# Patient Record
Sex: Male | Born: 1935 | Race: White | Hispanic: No | Smoking: Former smoker
Health system: Southern US, Community
[De-identification: ages and names within clinical notes are randomized; demographics above are authoritative.]

## PROBLEM LIST (undated history)

## (undated) DIAGNOSIS — I10 Essential (primary) hypertension: Secondary | ICD-10-CM

## (undated) DIAGNOSIS — I2510 Atherosclerotic heart disease of native coronary artery without angina pectoris: Secondary | ICD-10-CM

## (undated) DIAGNOSIS — I714 Abdominal aortic aneurysm, without rupture: Secondary | ICD-10-CM

## (undated) DIAGNOSIS — I219 Acute myocardial infarction, unspecified: Secondary | ICD-10-CM

## (undated) DIAGNOSIS — F0390 Unspecified dementia without behavioral disturbance: Secondary | ICD-10-CM

## (undated) DIAGNOSIS — E119 Type 2 diabetes mellitus without complications: Secondary | ICD-10-CM

## (undated) DIAGNOSIS — E785 Hyperlipidemia, unspecified: Secondary | ICD-10-CM

## (undated) DIAGNOSIS — I739 Peripheral vascular disease, unspecified: Secondary | ICD-10-CM

## (undated) DIAGNOSIS — I6529 Occlusion and stenosis of unspecified carotid artery: Secondary | ICD-10-CM

## (undated) DIAGNOSIS — J60 Coalworker's pneumoconiosis: Secondary | ICD-10-CM

## (undated) HISTORY — DX: Occlusion and stenosis of unspecified carotid artery: I65.29

## (undated) HISTORY — DX: Coalworker's pneumoconiosis: J60

## (undated) HISTORY — DX: Acute myocardial infarction, unspecified: I21.9

## (undated) HISTORY — DX: Peripheral vascular disease, unspecified: I73.9

## (undated) HISTORY — PX: AORTO-FEMORAL BYPASS GRAFT: SHX885

## (undated) HISTORY — DX: Essential (primary) hypertension: I10

## (undated) HISTORY — DX: Hyperlipidemia, unspecified: E78.5

## (undated) HISTORY — PX: CAROTID ENDARTERECTOMY: SUR193

## (undated) HISTORY — DX: Type 2 diabetes mellitus without complications: E11.9

## (undated) HISTORY — DX: Atherosclerotic heart disease of native coronary artery without angina pectoris: I25.10

## (undated) HISTORY — PX: HERNIA REPAIR: SHX51

---

## 2008-06-06 DIAGNOSIS — I714 Abdominal aortic aneurysm, without rupture, unspecified: Secondary | ICD-10-CM

## 2008-06-06 HISTORY — DX: Abdominal aortic aneurysm, without rupture, unspecified: I71.40

## 2008-06-06 HISTORY — DX: Abdominal aortic aneurysm, without rupture: I71.4

## 2011-11-25 ENCOUNTER — Encounter: Payer: Self-pay | Admitting: Cardiovascular Disease

## 2012-08-08 ENCOUNTER — Other Ambulatory Visit: Payer: Self-pay | Admitting: *Deleted

## 2012-08-08 DIAGNOSIS — I70269 Atherosclerosis of native arteries of extremities with gangrene, unspecified extremity: Secondary | ICD-10-CM

## 2012-08-09 ENCOUNTER — Encounter (INDEPENDENT_AMBULATORY_CARE_PROVIDER_SITE_OTHER): Payer: MEDICARE | Admitting: *Deleted

## 2012-08-09 ENCOUNTER — Encounter: Payer: Self-pay | Admitting: Vascular Surgery

## 2012-08-09 ENCOUNTER — Ambulatory Visit (INDEPENDENT_AMBULATORY_CARE_PROVIDER_SITE_OTHER): Payer: MEDICARE | Admitting: Vascular Surgery

## 2012-08-09 VITALS — BP 142/63 | HR 77 | Temp 97.5°F | Ht 70.0 in | Wt 176.0 lb

## 2012-08-09 DIAGNOSIS — I70269 Atherosclerosis of native arteries of extremities with gangrene, unspecified extremity: Secondary | ICD-10-CM | POA: Insufficient documentation

## 2012-08-09 DIAGNOSIS — Z01818 Encounter for other preprocedural examination: Secondary | ICD-10-CM

## 2012-08-09 NOTE — Progress Notes (Signed)
[facility] [place] HISTORY AND PHYSICAL   History of Present Illness:  Patient is a 79 y.o. year old male who presents for evaluation of  Osteomyelitis of his left first toe.  The patient is referred by Dr. [name]. The patient has moderate dementia. Most of the history was provided by his niece. The patient apparently has had drainage from his left first toe for approximately 2 years. He denies any rest pain in the left foot. He states that the area of the toe is sore to palpation. He is a former smoker but quit in April of last year. Dr. [name] has recommended amputation of his left first toe for osteomyelitis. However the patient did not have a normal vascular exam and was referred to [place] for possible revascularization. He also has a history of coronary artery disease with a myocardial infarction in July 2013. This was in [facility]. She has also been depressed over the last year and has had 40 pound weight loss. Other medical problems include diabetes, hypertension, coronary disease, hyperlipidemia, "black lung disease". These are currently stable. The patient's niece says that his dementia has been stable over the past year. He is able to perform his daily activities. He is ambulatory. He is on doxycycline.  Past Medical History  Diagnosis Date  . Peripheral vascular disease   . Diabetes mellitus without complication   . Hypertension   . Myocardial infarction   . Hyperlipidemia   . Black lung disease   . Carotid artery occlusion     Past Surgical History  Procedure Laterality Date  . Carotid endarterectomy Left   . Hernia repair     abdominal operation unknown procedure   Social History History  Substance Use Topics  . Smoking status: Former Smoker    Types: Cigarettes    Quit date: 06/07/2011  . Smokeless tobacco: Never Used  . Alcohol Use: No    Family History History reviewed. No pertinent family history.  Allergies  No Known Allergies   Current  Outpatient Prescriptions  Medication Sig Dispense Refill  . Acetaminophen (Q-PAP PO) Take 500 mg by mouth 2 (two) times daily.      Marland Kitchen aspirin 325 MG tablet Take 325 mg by mouth daily.      Marland Kitchen atorvastatin (LIPITOR) 80 MG tablet Take 80 mg by mouth daily.      . carvedilol (COREG) 12.5 MG tablet Take 12.5 mg by mouth. Take 1/2 tablet every 12 hours      . clopidogrel (PLAVIX) 75 MG tablet Take 75 mg by mouth daily.      Marland Kitchen diltiazem (CARDIZEM) 120 MG tablet Take 120 mg by mouth daily.      Marland Kitchen doxycycline (VIBRAMYCIN) 100 MG capsule Take 100 mg by mouth 2 (two) times daily.      . Glucerna (GLUCERNA) LIQD Take 237 mLs by mouth. Drink 1 shake 1-3 times per day      . haloperidol (HALDOL) 0.5 MG tablet Take 0.5 mg by mouth 2 (two) times daily.      Marland Kitchen lisinopril (PRINIVIL,ZESTRIL) 20 MG tablet Take 20 mg by mouth daily. Take 1/2 tablet daily      . LORazepam (ATIVAN) 0.5 MG tablet Take 0.5 mg by mouth every 6 (six) hours as needed for anxiety.      . metFORMIN (GLUCOPHAGE) 1000 MG tablet Take 1,000 mg by mouth daily.      . methocarbamol (ROBAXIN) 500 MG tablet Take 500 mg by mouth 2 (two)  times daily as needed.      . mirtazapine (REMERON) 15 MG tablet Take 15 mg by mouth at bedtime.      . sertraline (ZOLOFT) 50 MG tablet Take 50 mg by mouth daily.       No current facility-administered medications for this visit.    ROS:   General:  + weight loss, no Fever, chills  HEENT: No recent headaches, no nasal bleeding, no visual changes, no sore throat  Neurologic: No dizziness, blackouts, seizures. No recent symptoms of stroke or mini- stroke. No recent episodes of slurred speech, or temporary blindness.  Cardiac: No recent episodes of chest pain/pressure, no shortness of breath at rest.  + shortness of breath with exertion.  Denies history of atrial fibrillation or irregular heartbeat  Vascular: No history of rest pain in feet.  No history of claudication.  + history of non-healing ulcer, No  history of DVT   Pulmonary: No home oxygen, no productive cough, no hemoptysis,  No asthma or wheezing  Musculoskeletal:  [ ]  Arthritis, [ ]  Low back pain,  [ ]  Joint pain  Hematologic:No history of hypercoagulable state.  No history of easy bleeding.  No history of anemia  Gastrointestinal: No hematochezia or melena,  No gastroesophageal reflux, no trouble swallowing  Urinary: [ ]  chronic Kidney disease, [ ]  on HD - [ ]  MWF or [ ]  TTHS, [ ]  Burning with urination, [ ]  Frequent urination, [ ]  Difficulty urinating;   Skin: No rashes  Psychological: No history of anxiety,  +history of depression   Physical Examination  Filed Vitals:   08/09/12 1429  BP: 142/63  Pulse: 77  Temp: 97.5 F (36.4 C)  TempSrc: Oral  Height: 5\' 10"  (1.778 m)  Weight: 176 lb (79.833 kg)  SpO2: 100%    Body mass index is 25.25 kg/(m^2).  General:  Alert and oriented, no acute distress HEENT: Normal Neck: No bruit or JVD, well-healed left neck scar Pulmonary: Clear to auscultation bilaterally Cardiac: Regular Rate and Rhythm without murmur Abdomen: Soft, non-tender, non-distended, no mass, well-healed midline laparotomy scar with well-healed vertical incisions each groin Skin: No rash, 1 cm ulceration tip of left first toe with clear drainage Extremity Pulses:  2+ radial, brachial, femoral, absent dorsalis pedis, posterior tibial pulses bilaterally Musculoskeletal: No deformity or edema  Neurologic: Upper and lower extremity motor 5/5 and symmetric  DATA: The patient had bilateral ABIs today which are reviewed and interpreted. ABI on the left was 0.52 right was 0.66   ASSESSMENT: Osteomyelitis left first toe with marginal perfusion of the left lower extremity.  Unknown history but patient has scars in exam that could be consistent with prior aortobifemoral bypass.   PLAN:  CT angiogram abdomen and pelvis with lower extremity runoff which should give [place] his abdominal anatomy as well as an  indication whether or not he might be a candidate for percutaneous angioplasty or stenting of his lower extremities. However, if it looks like the patient would not be a candidate for percutaneous revascularization I wanted to have further conversations with the patient's niece regarding whether or not he would be a candidate for an open operation. If he is not a candidate for an open operation, would most likely have Dr. [name] proceed with toe amputation and hope that this heals. We will have a better indication of this after he returns in 2 weeks after his CT scan.  Fabienne Bruns, MD [facility] [place] Office: [phone] Pager: [phone]

## 2012-08-14 ENCOUNTER — Other Ambulatory Visit: Payer: Self-pay | Admitting: *Deleted

## 2012-08-14 DIAGNOSIS — I70269 Atherosclerosis of native arteries of extremities with gangrene, unspecified extremity: Secondary | ICD-10-CM

## 2012-08-22 ENCOUNTER — Encounter: Payer: Self-pay | Admitting: Vascular Surgery

## 2012-08-23 ENCOUNTER — Other Ambulatory Visit: Payer: Self-pay

## 2012-08-23 ENCOUNTER — Other Ambulatory Visit: Payer: Self-pay | Admitting: *Deleted

## 2012-08-23 ENCOUNTER — Ambulatory Visit
Admission: RE | Admit: 2012-08-23 | Discharge: 2012-08-23 | Disposition: A | Discharge status: Home / Self Care | Payer: 59 | Source: Ambulatory Visit | Attending: Vascular Surgery | Admitting: Vascular Surgery

## 2012-08-23 ENCOUNTER — Encounter (HOSPITAL_COMMUNITY): Payer: Self-pay

## 2012-08-23 ENCOUNTER — Encounter: Payer: Self-pay | Admitting: Vascular Surgery

## 2012-08-23 ENCOUNTER — Telehealth: Payer: Self-pay | Admitting: Vascular Surgery

## 2012-08-23 ENCOUNTER — Ambulatory Visit (INDEPENDENT_AMBULATORY_CARE_PROVIDER_SITE_OTHER): Payer: 59 | Admitting: Vascular Surgery

## 2012-08-23 VITALS — BP 123/108 | HR 99 | Ht 70.0 in | Wt 178.3 lb

## 2012-08-23 DIAGNOSIS — Z01818 Encounter for other preprocedural examination: Secondary | ICD-10-CM

## 2012-08-23 DIAGNOSIS — I70269 Atherosclerosis of native arteries of extremities with gangrene, unspecified extremity: Secondary | ICD-10-CM

## 2012-08-23 DIAGNOSIS — Z01810 Encounter for preprocedural cardiovascular examination: Secondary | ICD-10-CM

## 2012-08-23 DIAGNOSIS — R911 Solitary pulmonary nodule: Secondary | ICD-10-CM | POA: Insufficient documentation

## 2012-08-23 MED ORDER — IOHEXOL 350 MG/ML SOLN
150.0000 mL | Freq: Once | INTRAVENOUS | Status: AC | PRN
  Administered 2012-08-23: 150 mL via INTRAVENOUS

## 2012-08-23 NOTE — Progress Notes (Signed)
[facility] [place] History and Physical    History of Present Illness:  Patient is a 79 y.o. year old male who presents for follow up evaluation of  Osteomyelitis of his left first toe.  The patient is referred by Dr. [name]. The patient has moderate dementia. Most of the history was provided by his niece. The patient apparently has had drainage from his left first toe for approximately 2 years. He denies any rest pain in the left foot. He states that the area of the toe is sore to palpation. He is a former smoker but quit in April of last year. Dr. [name] has recommended amputation of his left first toe for osteomyelitis. However the patient did not have a normal vascular exam and was referred to [place] for possible revascularization. He returns today for further evaluation after a CT angiogram with lower extremity runoff. He also has a history of coronary artery disease with a myocardial infarction in July 2013. This was in [facility]. He has also been depressed over the last year and has had 40 pound weight loss. Other medical problems include diabetes, hypertension, coronary disease, hyperlipidemia, "black lung disease". These are currently stable. The patient's niece says that his dementia has been stable over the past year. He is able to perform his daily activities. He is ambulatory. He is on doxycycline.    Past Medical History   Diagnosis  Date   .  Peripheral vascular disease     .  Diabetes mellitus without complication     .  Hypertension     .  Myocardial infarction     .  Hyperlipidemia     .  Black lung disease     .  Carotid artery occlusion         Past Surgical History   Procedure  Laterality  Date   .  Carotid endarterectomy  Left     .  Hernia repair        abdominal operation unknown procedure   Social History History   Substance Use Topics   .  Smoking status:  Former Smoker       Types:  Cigarettes       Quit date:  06/07/2011   .  Smokeless  tobacco:  Never Used   .  Alcohol Use:  No     Family History History reviewed. No pertinent family history.  Allergies  No Known Allergies     Current Outpatient Prescriptions   Medication  Sig  Dispense  Refill   .  Acetaminophen (Q-PAP PO)  Take 500 mg by mouth 2 (two) times daily.         Marland Kitchen  aspirin 325 MG tablet  Take 325 mg by mouth daily.         Marland Kitchen  atorvastatin (LIPITOR) 80 MG tablet  Take 80 mg by mouth daily.         .  carvedilol (COREG) 12.5 MG tablet  Take 12.5 mg by mouth. Take 1/2 tablet every 12 hours         .  clopidogrel (PLAVIX) 75 MG tablet  Take 75 mg by mouth daily.         Marland Kitchen  diltiazem (CARDIZEM) 120 MG tablet  Take 120 mg by mouth daily.         Marland Kitchen  doxycycline (VIBRAMYCIN) 100 MG capsule  Take 100 mg by mouth 2 (two) times daily.         Marland Kitchen  Glucerna (GLUCERNA) LIQD  Take 237 mLs by mouth. Drink 1 shake 1-3 times per day         .  haloperidol (HALDOL) 0.5 MG tablet  Take 0.5 mg by mouth 2 (two) times daily.         Marland Kitchen  lisinopril (PRINIVIL,ZESTRIL) 20 MG tablet  Take 20 mg by mouth daily. Take 1/2 tablet daily         .  LORazepam (ATIVAN) 0.5 MG tablet  Take 0.5 mg by mouth every 6 (six) hours as needed for anxiety.         .  metFORMIN (GLUCOPHAGE) 1000 MG tablet  Take 1,000 mg by mouth daily.         .  methocarbamol (ROBAXIN) 500 MG tablet  Take 500 mg by mouth 2 (two) times daily as needed.         .  mirtazapine (REMERON) 15 MG tablet  Take 15 mg by mouth at bedtime.         .  sertraline (ZOLOFT) 50 MG tablet  Take 50 mg by mouth daily.            No current facility-administered medications for this visit.     Physical Examination  Filed Vitals:   08/23/12 1119  BP: 123/108  Pulse: 99  Height: 5\' 10"  (1.778 m)  Weight: 178 lb 4.8 oz (80.876 kg)  SpO2: 93%   General:  Alert and oriented, no acute distress HEENT: Normal Neck: No bruit or JVD, well-healed left neck scar Pulmonary: Clear to auscultation bilaterally Cardiac: Regular Rate and  Rhythm without murmur Abdomen: Soft, non-tender, non-distended, no mass, well-healed midline laparotomy scar with well-healed vertical incisions each groin Skin: No rash, 1 cm ulceration tip of left first toe with clear drainage no significant change from previous office visit Extremity Pulses:  2+ radial, brachial, femoral, absent dorsalis pedis, posterior tibial pulses bilaterally Musculoskeletal: No deformity or edema     Neurologic: Upper and lower extremity motor 5/5 and symmetric  DATA: Prior ABIs were reviewed ABI on the left was 0.52 right was 0.66. CT angiogram of the abdomen and pelvis with lower extremity runoff is reviewed. The patient has a patent aortobifemoral bypass graft. The right superficial femoral artery is occluded. There is reconstitution of the distal popliteal artery with three-vessel runoff. He has subtotal occlusion of the left superficial femoral artery with one-vessel runoff via the left peroneal. There is an old compression fracture of L1 vertebral body. He has a 50% right renal artery stenosis. He also was noted to have a 5 mm right middle lobe lung nodule which will need a followup CT scan in one year.   ASSESSMENT: Osteomyelitis left first toe with marginal perfusion of the left lower extremity.  patient with prior aortobifemoral bypass which would make angioplasty or stenting of his superficial femoral artery technically difficult. I believe the best option would be to perform lower extremity arteriogram for operative planning purposes and then consideration for a left lower extremity femoral to below-knee popliteal bypass. At the time of his bypass procedure we could arrange for Dr. [name] to do the left first toe amputation. Risks benefits possible complications and procedure details were trying to the patient and his family member today. The risks benefits and complications of angiogram as well as left femoropopliteal bypass were discussed. Since the patient had contrast  today we will schedule his arteriogram one week from today.  He will also need cardiac risk stratification.  Plan: See above  Fabienne Bruns, MD [facility] [place] Office: [phone] Pager: [phone]

## 2012-08-23 NOTE — Telephone Encounter (Signed)
Dr Darrick Penna has requested cardiac clearance prior to fem-pop. Scheduled at [facility] and notified Shadae at [facility], dpm

## 2012-08-27 ENCOUNTER — Ambulatory Visit (INDEPENDENT_AMBULATORY_CARE_PROVIDER_SITE_OTHER): Payer: MEDICARE | Admitting: Cardiovascular Disease

## 2012-08-27 ENCOUNTER — Encounter: Payer: Self-pay | Admitting: Cardiovascular Disease

## 2012-08-27 VITALS — BP 114/60 | HR 74 | Ht 70.0 in | Wt 177.1 lb

## 2012-08-27 DIAGNOSIS — E785 Hyperlipidemia, unspecified: Secondary | ICD-10-CM | POA: Insufficient documentation

## 2012-08-27 DIAGNOSIS — Z01810 Encounter for preprocedural cardiovascular examination: Secondary | ICD-10-CM

## 2012-08-27 DIAGNOSIS — I10 Essential (primary) hypertension: Secondary | ICD-10-CM | POA: Insufficient documentation

## 2012-08-27 DIAGNOSIS — I2510 Atherosclerotic heart disease of native coronary artery without angina pectoris: Secondary | ICD-10-CM | POA: Insufficient documentation

## 2012-08-27 NOTE — Patient Instructions (Signed)
Your physician has requested that you have a lexiscan myoview. For further information please visit [email or web address]. Please follow instruction sheet, as given.   

## 2012-08-27 NOTE — Progress Notes (Addendum)
History of Present Illness: 79 yo male with history of dementia, DM, HTN, HLD, CAD, PAD referred today by Dr. Darrick Penna with [facility] for cardiovascular examination prior to distal aortogram with possible fem-pop bypass. He has a history of PAD with prior aorto-bifemoral bypass. Recently with osteromyelitis of the left great toe. CTA LE per Dr. Darrick Penna with patent aorto-bifemoral bypasses with severe disease in the left SFA with one vessel runoff to the left foot. The patient apparently has had drainage from his left first toe for approximately 2 years. He is a former smoker but quit in April of last year. He also has a history of coronary artery disease with a myocardial infarction in July 2013 in [place] at [facility]. He is a poor historian. His niece is with him today. It is not clear what was done for his heart at that time in [place] but she is sure a cath was done but she is not sure if a stent was done. No records are available.   He tells me that he feels well. He denies any chest pain or SOB. No dizziness, near syncope or syncope.   Primary Care Physician: Herschel Senegal  Past Medical History  Diagnosis Date  . Peripheral vascular disease   . Diabetes mellitus without complication   . Hypertension   . Myocardial infarction   . Hyperlipidemia   . Black lung disease   . Carotid artery occlusion   . CAD (coronary artery disease)     MI 2013    Past Surgical History  Procedure Laterality Date  . Carotid endarterectomy Left   . Hernia repair    . Aorto-femoral bypass graft      Current Outpatient Prescriptions  Medication Sig Dispense Refill  . acetaminophen (TYLENOL) 500 MG tablet Take 1,000 mg by mouth 3 (three) times daily.      Marland Kitchen aspirin 325 MG EC tablet Take 325 mg by mouth daily.      Marland Kitchen atorvastatin (LIPITOR) 80 MG tablet Take 40 mg by mouth at bedtime.       . carvedilol (COREG) 12.5 MG tablet Take 6.25 mg by mouth 2 (two) times daily with a meal.       .  clopidogrel (PLAVIX) 75 MG tablet Take 75 mg by mouth daily.      Marland Kitchen diltiazem (CARDIZEM CD) 120 MG 24 hr capsule Take 120 mg by mouth daily. At 1300.      Marland Kitchen Glucerna (GLUCERNA) LIQD Take 237 mLs by mouth 3 (three) times daily.       . haloperidol (HALDOL) 0.5 MG tablet Take 0.5 mg by mouth every morning.       . haloperidol (HALDOL) 1 MG tablet Take 1 mg by mouth at bedtime.      Marland Kitchen lisinopril (PRINIVIL,ZESTRIL) 20 MG tablet Take 10 mg by mouth daily.       Marland Kitchen LORazepam (ATIVAN) 0.5 MG tablet Take 0.5 mg by mouth every 6 (six) hours as needed for anxiety (agitation or restlessness).       . metFORMIN (GLUCOPHAGE) 1000 MG tablet Take 1,000 mg by mouth 2 (two) times daily with a meal.       . methocarbamol (ROBAXIN) 500 MG tablet Take 500 mg by mouth 2 (two) times daily as needed (spasms).       . mirtazapine (REMERON) 15 MG tablet Take 15 mg by mouth at bedtime.      . sertraline (ZOLOFT) 50 MG tablet Take 50 mg by mouth  daily.       No current facility-administered medications for this visit.    No Known Allergies  History   Social History  . Marital Status: Divorced    Spouse Name: N/A    Number of Children: 1  . Years of Education: N/A   Occupational History  . Retired-Railroad worker    Social History Main Topics  . Smoking status: Former Smoker    Types: Cigarettes    Quit date: 12/05/2011  . Smokeless tobacco: Never Used  . Alcohol Use: No  . Drug Use: No  . Sexually Active: Not on file   Other Topics Concern  . Not on file   Social History Narrative  . No narrative on file    Family History  Problem Relation Age of Onset  . Stroke Brother     x2  . Stroke Mother     Review of Systems:  As stated in the HPI and otherwise negative.   BP 114/60  Pulse 74  Ht 5\' 10"  (1.778 m)  Wt 177 lb 1.9 oz (80.341 kg)  BMI 25.41 kg/m2  Physical Examination: General: Well developed, well nourished, NAD HEENT: OP clear, mucus membranes moist SKIN: warm, dry. No  rashes. Neuro: No focal deficits Musculoskeletal: Muscle strength 5/5 all ext Psychiatric: Mood and affect normal Neck: No JVD, no carotid bruits, no thyromegaly, no lymphadenopathy. Lungs:Clear bilaterally, no wheezes, rhonci, crackles Cardiovascular: Regular rate and rhythm. No murmurs, gallops or rubs. Abdomen:Soft. Bowel sounds present. Non-tender.  Extremities: No lower extremity edema. Pulses are non-palpable in the bilateral DP/PT.  EKG: NSR, rate 74 bpm. Normal EKG  Assessment and Plan:   1. CAD: :Per report of his niece, he had an MI in July 2013 in [place]. No records are available. His niece is not sure what was done. ? If a stent was placed. Will try to get records from [facility] in [place]. Regardless of what they did there, he will need a stress test prior to his high risk, major vascular surgery. Will arrange Lexiscan stress myoview since he cannot walk on a treadmill. He has at least moderate dementia and will be high risk for any surgery. He also has a lung nodule on recent CT scan which will need further evaluation. I will leave this workup to Dr. Darrick Penna. May need to get pulmonary involved. I will review the old records when available and his stress test results and will make further recs at that time in regards to his cardiac risk for vascular surgery. No changes in meds at this time. Addendum 08/31/12: Cath report from [facility] of the [place]. 11/25/11: LAD small to moderate caliber with moderate caliber diagonal, 40% mid LAD stenosis. 50-70% proximal Circumflex stenosis. Large dominant RCA with patent proximal stent, distal 40% stenosis, total occlusion PLA, 90% distal PDA stenosis.

## 2012-08-29 ENCOUNTER — Ambulatory Visit (HOSPITAL_BASED_OUTPATIENT_CLINIC_OR_DEPARTMENT_OTHER): Payer: MEDICARE | Admitting: Radiology

## 2012-08-29 DIAGNOSIS — I2510 Atherosclerotic heart disease of native coronary artery without angina pectoris: Secondary | ICD-10-CM

## 2012-08-29 DIAGNOSIS — Z01810 Encounter for preprocedural cardiovascular examination: Secondary | ICD-10-CM

## 2012-08-29 DIAGNOSIS — R0989 Other specified symptoms and signs involving the circulatory and respiratory systems: Secondary | ICD-10-CM

## 2012-08-29 MED ORDER — TECHNETIUM TC 99M SESTAMIBI GENERIC - CARDIOLITE
11.0000 | Freq: Once | INTRAVENOUS | Status: AC | PRN
  Administered 2012-08-29: 11 via INTRAVENOUS

## 2012-08-30 ENCOUNTER — Ambulatory Visit (HOSPITAL_BASED_OUTPATIENT_CLINIC_OR_DEPARTMENT_OTHER): Payer: MEDICARE | Admitting: Radiology

## 2012-08-30 ENCOUNTER — Telehealth: Payer: Self-pay | Admitting: Cardiovascular Disease

## 2012-08-30 VITALS — Ht 70.0 in | Wt 180.0 lb

## 2012-08-30 DIAGNOSIS — Z01810 Encounter for preprocedural cardiovascular examination: Secondary | ICD-10-CM

## 2012-08-30 DIAGNOSIS — R0602 Shortness of breath: Secondary | ICD-10-CM

## 2012-08-30 DIAGNOSIS — I2510 Atherosclerotic heart disease of native coronary artery without angina pectoris: Secondary | ICD-10-CM

## 2012-08-30 MED ORDER — SODIUM CHLORIDE 0.9 % IV SOLN
INTRAVENOUS | Status: DC
  Administered 2012-08-31: 10:00:00 via INTRAVENOUS

## 2012-08-30 MED ORDER — REGADENOSON 0.4 MG/5ML IV SOLN
0.4000 mg | Freq: Once | INTRAVENOUS | Status: AC
  Administered 2012-08-30: 0.4 mg via INTRAVENOUS

## 2012-08-30 MED ORDER — TECHNETIUM TC 99M SESTAMIBI GENERIC - CARDIOLITE
33.0000 | Freq: Once | INTRAVENOUS | Status: AC | PRN
  Administered 2012-08-30: 33 via INTRAVENOUS

## 2012-08-30 NOTE — Telephone Encounter (Signed)
ROI faxed to [facility] [phone] 08/30/12/KM

## 2012-08-30 NOTE — Progress Notes (Signed)
[facility] SITE 3 NUCLEAR MED [address] [place], [place] [address] [phone]    Cardiology Nuclear Med Study  Henry Mcdaniel. is a 79 y.o. male     MRN : [id number]     DOB: 05/14/1934  Procedure Date: 08/30/2012  Nuclear Med Background Indication for Stress Test:  Evaluation for Ischemia and Surgical Clearance Pre-op Fem-Pop. Bypass (Dr. Fabienne Bruns) History:  12-2011 MI> Cath @ [place]( no records available), history of Dementia Cardiac Risk Factors: Carotid Disease, History of Smoking, Hypertension, Lipids, NIDDM and PVD  Symptoms:  DOE and Fatigue   Nuclear Pre-Procedure Caffeine/Decaff Intake:  None NPO After: 7:00am   Lungs:  Clear, diminished breath sounds, no wheezing O2 Sat: 97-99% on room air. IV 0.9% NS with Angio Cath:  22g  IV Site: R Hand  IV Started by:  Bonnita Levan, RN  Chest Size (in):  46 Cup Size: n/a  Height: 5\' 10"  (1.778 m)  Weight:  180 lb (81.647 kg)  BMI:  Body mass index is 25.83 kg/(m^2). Tech Comments:  Patient took meds this AM. Patient changed to a 2 Day protocol due to having coffee.    Nuclear Med Study 1 or 2 day study: 2 day  Stress Test Type:  Eugenie Birks  Reading MD: Cassell Clement, MD  Order Authorizing Provider:  Verne Carrow, MD  Resting Radionuclide: Technetium 84m Sestamibi  Resting Radionuclide Dose: 11.0 mCi   Stress Radionuclide:  Technetium 69m Sestamibi  Stress Radionuclide Dose: 33.0 mCi           Stress Protocol Rest HR: 62 Stress HR: 101  Rest BP: 134/63 Stress BP: 116/56  Exercise Time (min): n/a METS: n/a   Predicted Max HR: 144 bpm % Max HR: 70.14 bpm Rate Pressure Product: 21308   Dose of Adenosine (mg):  n/a Dose of Lexiscan: 0.4 mg  Dose of Atropine (mg): n/a Dose of Dobutamine: n/a mcg/kg/min (at max HR)  Stress Test Technologist: Irean Hong, RN  Nuclear Technologist:  Domenic Polite, CNMT     Rest Procedure:  Myocardial perfusion imaging was performed at rest 45 minutes  following the intravenous administration of Technetium 71m Sestamibi. Rest ECG: NSR - Normal EKG  Stress Procedure:  The patient received IV Lexiscan 0.4 mg over 15-seconds.  Technetium 105m Sestamibi injected at 30-seconds. No symptoms per patient.  Quantitative spect images were obtained after a 45 minute delay. Stress ECG: No significant change from baseline ECG  QPS Raw Data Images:  Mild diaphragmatic attenuation.  Normal left ventricular size. Stress Images:  Normal homogeneous uptake in all areas of the myocardium. Rest Images:  Normal homogeneous uptake in all areas of the myocardium. Subtraction (SDS):  No evidence of ischemia. Transient Ischemic Dilatation (Normal <1.22):  1.22 Lung/Heart Ratio (Normal <0.45):  0.36  Quantitative Gated Spect Images QGS EDV:  72 ml QGS ESV:  29 ml  Impression Exercise Capacity:  Lexiscan with no exercise. BP Response:  Normal blood pressure response. Clinical Symptoms:  No significant symptoms noted. ECG Impression:  No significant ST segment change suggestive of ischemia. Comparison with Prior Nuclear Study: No previous nuclear study performed  Overall Impression:  Normal stress nuclear study.  LV Ejection Fraction: 60%.  LV Wall Motion:  NL LV Function; NL Wall Motion  [employer]

## 2012-08-31 ENCOUNTER — Ambulatory Visit (HOSPITAL_COMMUNITY)
Admission: RE | Admit: 2012-08-31 | Discharge: 2012-08-31 | Disposition: A | Discharge status: Home / Self Care | Payer: MEDICARE | Source: Ambulatory Visit | Attending: Vascular Surgery | Admitting: Vascular Surgery

## 2012-08-31 ENCOUNTER — Telehealth: Payer: Self-pay | Admitting: Vascular Surgery

## 2012-08-31 ENCOUNTER — Encounter (HOSPITAL_COMMUNITY)
Admission: RE | Disposition: A | Discharge status: Home / Self Care | Payer: Self-pay | Source: Ambulatory Visit | Attending: Vascular Surgery

## 2012-08-31 ENCOUNTER — Encounter: Payer: Self-pay | Admitting: Cardiovascular Disease

## 2012-08-31 ENCOUNTER — Encounter (HOSPITAL_COMMUNITY): Payer: Self-pay | Admitting: *Deleted

## 2012-08-31 ENCOUNTER — Telehealth: Payer: Self-pay | Admitting: Cardiovascular Disease

## 2012-08-31 DIAGNOSIS — I70219 Atherosclerosis of native arteries of extremities with intermittent claudication, unspecified extremity: Secondary | ICD-10-CM | POA: Insufficient documentation

## 2012-08-31 DIAGNOSIS — I70269 Atherosclerosis of native arteries of extremities with gangrene, unspecified extremity: Secondary | ICD-10-CM

## 2012-08-31 DIAGNOSIS — M869 Osteomyelitis, unspecified: Secondary | ICD-10-CM | POA: Insufficient documentation

## 2012-08-31 DIAGNOSIS — I10 Essential (primary) hypertension: Secondary | ICD-10-CM | POA: Insufficient documentation

## 2012-08-31 DIAGNOSIS — E1169 Type 2 diabetes mellitus with other specified complication: Secondary | ICD-10-CM | POA: Insufficient documentation

## 2012-08-31 DIAGNOSIS — M9080 Osteopathy in diseases classified elsewhere, unspecified site: Secondary | ICD-10-CM | POA: Insufficient documentation

## 2012-08-31 DIAGNOSIS — F0390 Unspecified dementia without behavioral disturbance: Secondary | ICD-10-CM | POA: Insufficient documentation

## 2012-08-31 HISTORY — DX: Abdominal aortic aneurysm, without rupture: I71.4

## 2012-08-31 HISTORY — PX: ABDOMINAL AORTAGRAM: SHX5454

## 2012-08-31 HISTORY — PX: LOWER EXTREMITY ANGIOGRAM: SHX5508

## 2012-08-31 LAB — POCT I-STAT, CHEM 8
BUN: 23 mg/dL (ref 6–23)
Calcium, Ion: 1.26 mmol/L (ref 1.13–1.30)
Chloride: 105 mEq/L (ref 96–112)
Glucose, Bld: 184 mg/dL — ABNORMAL HIGH (ref 70–99)
TCO2: 30 mmol/L (ref 0–100)

## 2012-08-31 LAB — GLUCOSE, CAPILLARY: Glucose-Capillary: 166 mg/dL — ABNORMAL HIGH (ref 70–99)

## 2012-08-31 SURGERY — ABDOMINAL AORTAGRAM
Anesthesia: LOCAL

## 2012-08-31 MED ORDER — METOPROLOL TARTRATE 1 MG/ML IV SOLN: 2.0000 mg | INTRAVENOUS | Status: DC | PRN

## 2012-08-31 MED ORDER — LABETALOL HCL 5 MG/ML IV SOLN: 10.0000 mg | INTRAVENOUS | Status: DC | PRN

## 2012-08-31 MED ORDER — HYDRALAZINE HCL 20 MG/ML IJ SOLN: 10.0000 mg | INTRAMUSCULAR | Status: DC | PRN

## 2012-08-31 MED ORDER — ACETAMINOPHEN 325 MG RE SUPP: 325.0000 mg | RECTAL | Status: DC | PRN

## 2012-08-31 MED ORDER — MORPHINE SULFATE 10 MG/ML IJ SOLN: 2.0000 mg | INTRAMUSCULAR | Status: DC | PRN

## 2012-08-31 MED ORDER — LIDOCAINE HCL (PF) 1 % IJ SOLN
INTRAMUSCULAR | Status: AC
  Filled 2012-08-31: qty 30

## 2012-08-31 MED ORDER — HEPARIN (PORCINE) IN NACL 2-0.9 UNIT/ML-% IJ SOLN
INTRAMUSCULAR | Status: AC
  Filled 2012-08-31: qty 500

## 2012-08-31 MED ORDER — PHENOL 1.4 % MT LIQD: 1.0000 | OROMUCOSAL | Status: DC | PRN

## 2012-08-31 MED ORDER — ONDANSETRON HCL 4 MG/2ML IJ SOLN: 4.0000 mg | Freq: Four times a day (QID) | INTRAMUSCULAR | Status: DC | PRN

## 2012-08-31 MED ORDER — GUAIFENESIN-DM 100-10 MG/5ML PO SYRP: 15.0000 mL | ORAL_SOLUTION | ORAL | Status: DC | PRN

## 2012-08-31 MED ORDER — ACETAMINOPHEN 325 MG PO TABS: 325.0000 mg | ORAL_TABLET | ORAL | Status: DC | PRN

## 2012-08-31 NOTE — H&P (View-Only) (Signed)
[facility] [place] History and Physical    History of Present Illness:  Patient is a 79 y.o. year old male who presents for follow up evaluation of  Osteomyelitis of his left first toe.  The patient is referred by Dr. [name]. The patient has moderate dementia. Most of the history was provided by his niece. The patient apparently has had drainage from his left first toe for approximately 2 years. He denies any rest pain in the left foot. He states that the area of the toe is sore to palpation. He is a former smoker but quit in April of last year. Dr. [name] has recommended amputation of his left first toe for osteomyelitis. However the patient did not have a normal vascular exam and was referred to us for possible revascularization. He returns today for further evaluation after a CT angiogram with lower extremity runoff. He also has a history of coronary artery disease with a myocardial infarction in July 2013. This was in [place]. He has also been depressed over the last year and has had 40 pound weight loss. Other medical problems include diabetes, hypertension, coronary disease, hyperlipidemia, "black lung disease". These are currently stable. The patient's niece says that his dementia has been stable over the past year. He is able to perform his daily activities. He is ambulatory. He is on doxycycline.    Past Medical History   Diagnosis  Date   .  Peripheral vascular disease     .  Diabetes mellitus without complication     .  Hypertension     .  Myocardial infarction     .  Hyperlipidemia     .  Black lung disease     .  Carotid artery occlusion         Past Surgical History   Procedure  Laterality  Date   .  Carotid endarterectomy  Left     .  Hernia repair        abdominal operation unknown procedure   Social History History   Substance Use Topics   .  Smoking status:  Former Smoker       Types:  Cigarettes       Quit date:  06/07/2011   .  Smokeless  tobacco:  Never Used   .  Alcohol Use:  No     Family History History reviewed. No pertinent family history.  Allergies  No Known Allergies     Current Outpatient Prescriptions   Medication  Sig  Dispense  Refill   .  Acetaminophen (Q-PAP PO)  Take 500 mg by mouth 2 (two) times daily.         .  aspirin 325 MG tablet  Take 325 mg by mouth daily.         .  atorvastatin (LIPITOR) 80 MG tablet  Take 80 mg by mouth daily.         .  carvedilol (COREG) 12.5 MG tablet  Take 12.5 mg by mouth. Take 1/2 tablet every 12 hours         .  clopidogrel (PLAVIX) 75 MG tablet  Take 75 mg by mouth daily.         .  diltiazem (CARDIZEM) 120 MG tablet  Take 120 mg by mouth daily.         .  doxycycline (VIBRAMYCIN) 100 MG capsule  Take 100 mg by mouth 2 (two) times daily.         .    Glucerna (GLUCERNA) LIQD  Take 237 mLs by mouth. Drink 1 shake 1-3 times per day         .  haloperidol (HALDOL) 0.5 MG tablet  Take 0.5 mg by mouth 2 (two) times daily.         .  lisinopril (PRINIVIL,ZESTRIL) 20 MG tablet  Take 20 mg by mouth daily. Take 1/2 tablet daily         .  LORazepam (ATIVAN) 0.5 MG tablet  Take 0.5 mg by mouth every 6 (six) hours as needed for anxiety.         .  metFORMIN (GLUCOPHAGE) 1000 MG tablet  Take 1,000 mg by mouth daily.         .  methocarbamol (ROBAXIN) 500 MG tablet  Take 500 mg by mouth 2 (two) times daily as needed.         .  mirtazapine (REMERON) 15 MG tablet  Take 15 mg by mouth at bedtime.         .  sertraline (ZOLOFT) 50 MG tablet  Take 50 mg by mouth daily.            No current facility-administered medications for this visit.     Physical Examination  Filed Vitals:   08/23/12 1119  BP: 123/108  Pulse: 99  Height: 5' 10" (1.778 m)  Weight: 178 lb 4.8 oz (80.876 kg)  SpO2: 93%   General:  Alert and oriented, no acute distress HEENT: Normal Neck: No bruit or JVD, well-healed left neck scar Pulmonary: Clear to auscultation bilaterally Cardiac: Regular Rate and  Rhythm without murmur Abdomen: Soft, non-tender, non-distended, no mass, well-healed midline laparotomy scar with well-healed vertical incisions each groin Skin: No rash, 1 cm ulceration tip of left first toe with clear drainage no significant change from previous office visit Extremity Pulses:  2+ radial, brachial, femoral, absent dorsalis pedis, posterior tibial pulses bilaterally Musculoskeletal: No deformity or edema     Neurologic: Upper and lower extremity motor 5/5 and symmetric  DATA: Prior ABIs were reviewed ABI on the left was 0.52 right was 0.66. CT angiogram of the abdomen and pelvis with lower extremity runoff is reviewed. The patient has a patent aortobifemoral bypass graft. The right superficial femoral artery is occluded. There is reconstitution of the distal popliteal artery with three-vessel runoff. He has subtotal occlusion of the left superficial femoral artery with one-vessel runoff via the left peroneal. There is an old compression fracture of L1 vertebral body. He has a 50% right renal artery stenosis. He also was noted to have a 5 mm right middle lobe lung nodule which will need a followup CT scan in one year.   ASSESSMENT: Osteomyelitis left first toe with marginal perfusion of the left lower extremity.  patient with prior aortobifemoral bypass which would make angioplasty or stenting of his superficial femoral artery technically difficult. I believe the best option would be to perform lower extremity arteriogram for operative planning purposes and then consideration for a left lower extremity femoral to below-knee popliteal bypass. At the time of his bypass procedure we could arrange for Dr. [name] to do the left first toe amputation. Risks benefits possible complications and procedure details were trying to the patient and his family member today. The risks benefits and complications of angiogram as well as left femoropopliteal bypass were discussed. Since the patient had contrast  today we will schedule his arteriogram one week from today.  He will also need cardiac risk stratification.    Plan: See above  Cecilia Vancleve, MD [facility] [place] Office: [phone] Pager: [phone]  

## 2012-08-31 NOTE — Op Note (Signed)
      [facility]           OF [place]   Procedure: Aortogram with bilateral lower extremity runoff  Preoperative diagnosis: Claudication  Postoperative diagnosis: Same  Anesthesia Local  Operative details: After obtaining informed consent, the patient was taken to the PV LAB. The patient was placed in supine position on the Angio table. Both groins were prepped and draped in usual sterile fashion. Local anesthesia was infiltrated over the right common femoral artery. Initially, ultrasound was used to identify the common femoral artery and an attempt was made to use a micropuncture to establish access.    An introducer needle was used to cannulate the right common femoral artery and 035 versacore wire threaded into the abdominal aorta under fluoroscopic guidance. Next a 5 French sheath is placed over the guidewire in the right common femoral artery. A 5 French pigtail catheter was placed over the guidewire into the abdominal aorta and abdominal aortogram was obtained.  The patient has a pre-existing and 2 side aortobifemoral bypass. This is patent. The left and right renal arteries are patent. There is a 40% stenosis of the right renal artery. The iliac limbs of the aortobifemoral bypass are patent. There is no filling of the distal native iliac arteries. Next the pigtail catheter was pulled down into the aortic graft and lower extremity runoff views obtained. The left and right common and profunda femoris arteries are patent.  In the right lower extremity, the right superficial femoral artery is occluded. The popliteal artery reconstitutes via profunda collaterals. There is two-vessel runoff via the peroneal and posterior tibial artery to the right foot.  In the left lower extremity, the left superficial femoral artery is diffusely diseased throughout its entire course with multiple segments of 70-90% stenosis. The popliteal artery is patent. There is one-vessel runoff to the  left foot. This is via the peroneal artery.       Next the pigtail catheter was removed a right guidewire.The 5 Fr sheath was pulled and hemostasis obtained with direct pressure. The patient tolerated the procedure well and there were no complications. Patient was taken to the holding area in stable condition.  Operative findings: Patent aortobifemoral bypass with him to side anastomosis.  Right superficial femoral artery occlusion with two-vessel runoff via the peroneal and posterior tibial artery.  Diffusely diseased but patent left superficial femoral artery. One-vessel runoff to the left foot via the peroneal.   Operative management: The patient will be scheduled in the near future for a left femoral to above-knee popliteal bypass. After that the patient should have reasonable perfusion to heal a left first toe amputation by Dr. [name].  Fabienne Bruns, MD [facility] [place] Office: [phone] Pager: [phone]

## 2012-08-31 NOTE — Telephone Encounter (Addendum)
Message copied by Rosalyn Charters on Fri Aug 31, 2012  3:34 PM ------      Message from: [name]      Created: Fri Aug 31, 2012 11:43 AM       He needs a referral to pulmonary for "black lung" to see if ok for surgery per Dr Thomasene Lot and his POA      ----- Message -----         From: Sherren Kerns, MD         Sent: 08/31/2012  11:03 AM           To: [name], [name], RN            Aortogram with bilat runoff            He needs to be scheduled for left fem pop bypass and [name] to amputatate first toe same day.  This can be done whenever you can coordinate my schedule with [name].  His power of atty is Henry Mcdaniel [phone].  His niece is usually with him her name is Henry Mcdaniel.            Henry Mcdaniel       ------  notified patient's nephew of appt. with dr. Sherene Sires [phone] 09-10-12 at 9:30

## 2012-08-31 NOTE — Interval H&P Note (Signed)
History and Physical Interval Note:  08/31/2012 10:02 AM  Henry Mcdaniel.  has presented today for surgery, with the diagnosis of pvd  The various methods of treatment have been discussed with the patient and family. After consideration of risks, benefits and other options for treatment, the patient has consented to  Procedure(s): ABDOMINAL AORTAGRAM (N/A) as a surgical intervention .  The patient's history has been reviewed, patient examined, no change in status, stable for surgery.  I have reviewed the patient's chart and labs.  Questions were answered to the patient's satisfaction.     FIELDS,CHARLES E

## 2012-09-04 ENCOUNTER — Ambulatory Visit (INDEPENDENT_AMBULATORY_CARE_PROVIDER_SITE_OTHER): Payer: MEDICARE | Admitting: Internal Medicine

## 2012-09-04 ENCOUNTER — Encounter: Payer: Self-pay | Admitting: Internal Medicine

## 2012-09-04 VITALS — BP 110/76 | HR 72 | Temp 97.5°F | Ht 69.0 in | Wt 181.0 lb

## 2012-09-04 DIAGNOSIS — R911 Solitary pulmonary nodule: Secondary | ICD-10-CM

## 2012-09-04 NOTE — Patient Instructions (Addendum)
You are cleared for surgery but I would recommend a follow up pulmonary visit in 6 months with a cxr

## 2012-09-04 NOTE — Progress Notes (Signed)
  Subjective:    Patient ID: Henry Mcdaniel., male    DOB: 1933-07-02  MRN: [id number]  HPI  4 yowm quit smoking 12/2011 in assisted living due to memory issues followed by [facility] referred by Dr Darrick Penna for preop eval of abn cxr    09/04/2012 1st pulmonary eval cc doe x years, no worse since quit smoking, only occurs with exertion and not at rest or noct or need for saba  No obvious daytime variabilty or assoc chronic cough or cp or chest tightness, subjective wheeze overt sinus or hb symptoms. No unusual exp hx or h/o childhood pna/ asthma or premature birth to his knowledge.   Sleeping ok without nocturnal  or early am exacerbation  of respiratory  c/o's or need for noct saba. Also denies any obvious fluctuation of symptoms with weather or environmental changes or other aggravating or alleviating factors except as outlined above   Review of Systems  Constitutional: Positive for appetite change and unexpected weight change. Negative for fever.  HENT: Negative for ear pain, nosebleeds, congestion, sore throat, rhinorrhea, sneezing, trouble swallowing, dental problem, postnasal drip and sinus pressure.   Eyes: Negative for redness and itching.  Respiratory: Positive for shortness of breath. Negative for cough, chest tightness and wheezing.   Cardiovascular: Negative for palpitations and leg swelling.  Gastrointestinal: Negative for nausea and vomiting.  Genitourinary: Negative for dysuria.  Musculoskeletal: Negative for joint swelling.  Skin: Negative for rash.  Neurological: Negative for headaches.  Hematological: Does not bruise/bleed easily.  Psychiatric/Behavioral: Negative for dysphoric mood. The patient is not nervous/anxious.        Objective:   Physical Exam  amb wm nad Wt Readings from Last 3 Encounters:  09/04/12 181 lb (82.101 kg)  08/31/12 180 lb (81.647 kg)  08/31/12 180 lb (81.647 kg)    HEENT: nl dentition, turbinates, and orophanx. Nl external ear canals without  cough reflex   NECK :  without JVD/Nodes/TM/ nl carotid upstrokes bilaterally   LUNGS: no acc muscle use, clear to A and P bilaterally without cough on insp or exp maneuvers   CV:  RRR  no s3 or murmur or increase in P2, no edema   ABD:  soft and nontender with nl excursion in the supine position. No bruits or organomegaly, bowel sounds nl  MS:  warm without deformities, calf tenderness, cyanosis or clubbing  SKIN: warm and dry without lesions    NEURO:  alert, approp, no deficits   Ct 08/23/12 5 mm right middle lobe pulmonary nodule. If the patient  is at high risk for bronchogenic carcinoma, follow-up chest CT at 6-  12 months is recommended.         Assessment & Plan:

## 2012-09-06 NOTE — Assessment & Plan Note (Signed)
Although there are clearly abnormalities on CT scan, they should probably be considered "microscopic" since not obvious on plain cxr .     In the setting of obvious "macroscopic" health issues,  There is no need to approach this patient differently in the short run but do rec f/u in 6 months as per guidelines for limited ct of the spn which is most likely benign  Discussed in detail all the  indications, usual  risks and alternatives  relative to the benefits with patient who agrees to proceed with conservative f/u as outlined above, placed in tickle file for recall 02/23/13

## 2012-09-07 ENCOUNTER — Other Ambulatory Visit: Payer: Self-pay | Admitting: *Deleted

## 2012-09-10 ENCOUNTER — Institutional Professional Consult (permissible substitution): Payer: MEDICARE | Admitting: Internal Medicine

## 2012-09-12 ENCOUNTER — Encounter (HOSPITAL_COMMUNITY): Payer: Self-pay | Admitting: Pharmacy Technician

## 2012-09-17 ENCOUNTER — Encounter (HOSPITAL_COMMUNITY): Payer: Self-pay

## 2012-09-17 ENCOUNTER — Ambulatory Visit (HOSPITAL_COMMUNITY)
Admission: RE | Admit: 2012-09-17 | Discharge: 2012-09-17 | Disposition: A | Discharge status: Home / Self Care | Payer: MEDICARE | Source: Ambulatory Visit | Attending: Anesthesiology | Admitting: Anesthesiology

## 2012-09-17 ENCOUNTER — Encounter (HOSPITAL_COMMUNITY)
Admission: RE | Admit: 2012-09-17 | Discharge: 2012-09-17 | Disposition: A | Discharge status: Home / Self Care | Payer: MEDICARE | Source: Ambulatory Visit | Attending: Vascular Surgery | Admitting: Vascular Surgery

## 2012-09-17 DIAGNOSIS — I739 Peripheral vascular disease, unspecified: Secondary | ICD-10-CM | POA: Insufficient documentation

## 2012-09-17 DIAGNOSIS — I252 Old myocardial infarction: Secondary | ICD-10-CM | POA: Insufficient documentation

## 2012-09-17 DIAGNOSIS — I2510 Atherosclerotic heart disease of native coronary artery without angina pectoris: Secondary | ICD-10-CM | POA: Insufficient documentation

## 2012-09-17 DIAGNOSIS — F0390 Unspecified dementia without behavioral disturbance: Secondary | ICD-10-CM | POA: Insufficient documentation

## 2012-09-17 DIAGNOSIS — I6529 Occlusion and stenosis of unspecified carotid artery: Secondary | ICD-10-CM | POA: Insufficient documentation

## 2012-09-17 DIAGNOSIS — J60 Coalworker's pneumoconiosis: Secondary | ICD-10-CM | POA: Insufficient documentation

## 2012-09-17 DIAGNOSIS — I10 Essential (primary) hypertension: Secondary | ICD-10-CM | POA: Insufficient documentation

## 2012-09-17 DIAGNOSIS — E785 Hyperlipidemia, unspecified: Secondary | ICD-10-CM | POA: Insufficient documentation

## 2012-09-17 DIAGNOSIS — E119 Type 2 diabetes mellitus without complications: Secondary | ICD-10-CM | POA: Insufficient documentation

## 2012-09-17 DIAGNOSIS — Z01812 Encounter for preprocedural laboratory examination: Secondary | ICD-10-CM | POA: Insufficient documentation

## 2012-09-17 DIAGNOSIS — Z01818 Encounter for other preprocedural examination: Secondary | ICD-10-CM | POA: Insufficient documentation

## 2012-09-17 DIAGNOSIS — Z87891 Personal history of nicotine dependence: Secondary | ICD-10-CM | POA: Insufficient documentation

## 2012-09-17 HISTORY — DX: Unspecified dementia, unspecified severity, without behavioral disturbance, psychotic disturbance, mood disturbance, and anxiety: F03.90

## 2012-09-17 LAB — COMPREHENSIVE METABOLIC PANEL
Albumin: 3.6 g/dL (ref 3.5–5.2)
BUN: 26 mg/dL — ABNORMAL HIGH (ref 6–23)
Chloride: 104 mEq/L (ref 96–112)
Creatinine, Ser: 1.22 mg/dL (ref 0.50–1.35)
GFR calc Af Amer: 65 mL/min — ABNORMAL LOW (ref >90)
Total Bilirubin: 0.3 mg/dL (ref 0.3–1.2)
Total Protein: 7.1 g/dL (ref 6.0–8.3)

## 2012-09-17 LAB — PROTIME-INR
INR: 1 (ref 0.00–1.49)
Prothrombin Time: 13.1 seconds (ref 11.6–15.2)

## 2012-09-17 LAB — SURGICAL PCR SCREEN: MRSA, PCR: NEGATIVE

## 2012-09-17 LAB — TYPE AND SCREEN

## 2012-09-17 LAB — CBC
HCT: 37.6 % — ABNORMAL LOW (ref 39.0–52.0)
MCHC: 34.3 g/dL (ref 30.0–36.0)
MCV: 90.8 fL (ref 78.0–100.0)
RDW: 13.8 % (ref 11.5–15.5)

## 2012-09-17 NOTE — Pre-Procedure Instructions (Addendum)
Henry Mcdaniel.  09/17/2012   Your procedure is scheduled on:  September 24, 2012  Report to [facility] Short Stay Center at 5:30 AM.  Call this number if you have problems the morning of surgery: [phone]   Remember:   Do not eat food or drink liquids after midnight.   Take these medicines the morning of surgery with A SIP OF WATER: tylenol, carvedilol(coreg), diltiazem(cardizem), sertraline(zoloft)    Do not wear jewelry, make-up or nail polish.  Do not wear lotions, powders, or perfumes. You may wear deodorant.  Do not shave 48 hours prior to surgery. Men may shave face and neck.  Do not bring valuables to the hospital.  Contacts, dentures or bridgework may not be worn into surgery.  Leave suitcase in the car. After surgery it may be brought to your room.  For patients admitted to the hospital, checkout time is 11:00 AM the day of  discharge.   Patients discharged the day of surgery will not be allowed to drive  home.  Name and phone number of your driver: family/friend  Special Instructions: Shower using CHG 2 nights before surgery and the night before surgery.  If you shower the day of surgery use CHG.  Use special wash - you have one bottle of CHG for all showers.  You should use approximately 1/3 of the bottle for each shower.   Please read over the following fact sheets that you were given: Pain Booklet, Coughing and Deep Breathing, Blood Transfusion Information and Surgical Site Infection Prevention

## 2012-09-20 NOTE — Progress Notes (Signed)
Anesthesia chart review: Patient is a 79 year old male scheduled for left femoral-popliteal bypass graft by Dr. Darrick Penna on 09/24/2012. History includes CAD/MI 12/2011 [facility] in [place]), former smoker since 12/2011, "black lung disease", 5 mm RML lung node by 08/23/12 CT with 6-12 months follow-up recommended, AAA/PAD s/p AFBG, HLD, DM2, carotid occlusive diease s/p left CEA, moderate dementia.  He was seen by cardiologist Dr. Clifton Irbin and pulmonologist Dr. Sherene Sires for preoperative clearance (see Notes in Epic).    Nuclear stress test on 08/30/12 showed: Normal stress nuclear study. LV Ejection Fraction: 60%. LV Wall Motion: NL LV Function; NL Wall Motion.  Cardiac cath from [facility] on 11/25/11 (scanned under Media tab, 11/25/11): LAD small to moderate caliber with moderate caliber diagonal, 40% mid LAD stenosis. 50-70% proximal Circumflex stenosis. Large dominant RCA with patent proximal stent, distal 40% stenosis, total occlusion PLA, 90% distal PDA stenosis.   EKG on 08/27/12 showed NSR, probable insignificant q waves in inferior leads.  CXR on 09/17/12 showed no acute abnormalities.  Preoperative labs noted.  He is scheduled for a UA on the day of surgery. (I think that he was unable to provide a specimen at his PAT appointment.)  He has been cleared by pulmonology and cardiology.  If no significant change in his status then would anticipate he could proceed as planned.  Velna Ochs [facility] Short Stay Center/Anesthesiology Phone [phone] 09/20/2012 9:39 AM

## 2012-09-23 MED ORDER — DEXTROSE 5 % IV SOLN
1.5000 g | INTRAVENOUS | Status: AC
  Administered 2012-09-24: 1.5 g via INTRAVENOUS
  Filled 2012-09-23: qty 1.5

## 2012-09-24 ENCOUNTER — Encounter (HOSPITAL_COMMUNITY): Payer: Self-pay | Admitting: Vascular Surgery

## 2012-09-24 ENCOUNTER — Encounter (HOSPITAL_COMMUNITY)
Admission: RE | Disposition: A | Discharge status: Skilled Nursing Facility | Payer: Self-pay | Source: Ambulatory Visit | Attending: Vascular Surgery

## 2012-09-24 ENCOUNTER — Inpatient Hospital Stay (HOSPITAL_COMMUNITY): Payer: MEDICARE

## 2012-09-24 ENCOUNTER — Encounter: Payer: Self-pay | Admitting: Physician Assistant

## 2012-09-24 ENCOUNTER — Telehealth: Payer: Self-pay | Admitting: Vascular Surgery

## 2012-09-24 ENCOUNTER — Inpatient Hospital Stay (HOSPITAL_COMMUNITY): Payer: MEDICARE | Admitting: Certified Registered"

## 2012-09-24 ENCOUNTER — Encounter (HOSPITAL_COMMUNITY): Payer: Self-pay | Admitting: *Deleted

## 2012-09-24 ENCOUNTER — Inpatient Hospital Stay (HOSPITAL_COMMUNITY)
Admission: RE | Admit: 2012-09-24 | Discharge: 2012-09-27 | DRG: 253 | Disposition: A | Discharge status: Skilled Nursing Facility | Payer: MEDICARE | Source: Ambulatory Visit | Attending: Vascular Surgery | Admitting: Vascular Surgery

## 2012-09-24 DIAGNOSIS — Z87891 Personal history of nicotine dependence: Secondary | ICD-10-CM

## 2012-09-24 DIAGNOSIS — I2510 Atherosclerotic heart disease of native coronary artery without angina pectoris: Secondary | ICD-10-CM | POA: Diagnosis present

## 2012-09-24 DIAGNOSIS — M9080 Osteopathy in diseases classified elsewhere, unspecified site: Secondary | ICD-10-CM | POA: Diagnosis present

## 2012-09-24 DIAGNOSIS — R911 Solitary pulmonary nodule: Secondary | ICD-10-CM | POA: Diagnosis present

## 2012-09-24 DIAGNOSIS — E1169 Type 2 diabetes mellitus with other specified complication: Secondary | ICD-10-CM | POA: Diagnosis present

## 2012-09-24 DIAGNOSIS — I70269 Atherosclerosis of native arteries of extremities with gangrene, unspecified extremity: Principal | ICD-10-CM | POA: Diagnosis present

## 2012-09-24 DIAGNOSIS — F329 Major depressive disorder, single episode, unspecified: Secondary | ICD-10-CM | POA: Diagnosis present

## 2012-09-24 DIAGNOSIS — I252 Old myocardial infarction: Secondary | ICD-10-CM

## 2012-09-24 DIAGNOSIS — Z7902 Long term (current) use of antithrombotics/antiplatelets: Secondary | ICD-10-CM

## 2012-09-24 DIAGNOSIS — M86679 Other chronic osteomyelitis, unspecified ankle and foot: Secondary | ICD-10-CM | POA: Diagnosis present

## 2012-09-24 DIAGNOSIS — F0390 Unspecified dementia without behavioral disturbance: Secondary | ICD-10-CM | POA: Diagnosis present

## 2012-09-24 DIAGNOSIS — R634 Abnormal weight loss: Secondary | ICD-10-CM | POA: Diagnosis present

## 2012-09-24 DIAGNOSIS — D62 Acute posthemorrhagic anemia: Secondary | ICD-10-CM | POA: Diagnosis not present

## 2012-09-24 DIAGNOSIS — J60 Coalworker's pneumoconiosis: Secondary | ICD-10-CM | POA: Diagnosis present

## 2012-09-24 DIAGNOSIS — E785 Hyperlipidemia, unspecified: Secondary | ICD-10-CM | POA: Diagnosis present

## 2012-09-24 DIAGNOSIS — I10 Essential (primary) hypertension: Secondary | ICD-10-CM | POA: Diagnosis present

## 2012-09-24 DIAGNOSIS — I70219 Atherosclerosis of native arteries of extremities with intermittent claudication, unspecified extremity: Secondary | ICD-10-CM

## 2012-09-24 DIAGNOSIS — Z79899 Other long term (current) drug therapy: Secondary | ICD-10-CM

## 2012-09-24 DIAGNOSIS — Z7982 Long term (current) use of aspirin: Secondary | ICD-10-CM

## 2012-09-24 DIAGNOSIS — F3289 Other specified depressive episodes: Secondary | ICD-10-CM | POA: Diagnosis present

## 2012-09-24 HISTORY — PX: AMPUTATION: SHX166

## 2012-09-24 HISTORY — PX: FEMORAL-POPLITEAL BYPASS GRAFT: SHX937

## 2012-09-24 LAB — CREATININE, SERUM
Creatinine, Ser: 0.96 mg/dL (ref 0.50–1.35)
GFR calc Af Amer: >90 mL/min (ref >90)
GFR calc non Af Amer: 79 mL/min — ABNORMAL LOW (ref >90)

## 2012-09-24 LAB — POCT I-STAT, CHEM 8
BUN: 34 mg/dL — ABNORMAL HIGH (ref 6–23)
Chloride: 107 mEq/L (ref 96–112)
Creatinine, Ser: 1.1 mg/dL (ref 0.50–1.35)
Glucose, Bld: 165 mg/dL — ABNORMAL HIGH (ref 70–99)
Hemoglobin: 13.6 g/dL (ref 13.0–17.0)
Potassium: 4.6 mEq/L (ref 3.5–5.1)

## 2012-09-24 LAB — CBC
HCT: 33.1 % — ABNORMAL LOW (ref 39.0–52.0)
Hemoglobin: 11.4 g/dL — ABNORMAL LOW (ref 13.0–17.0)
MCHC: 34.4 g/dL (ref 30.0–36.0)
RBC: 3.62 MIL/uL — ABNORMAL LOW (ref 4.22–5.81)
WBC: 6.6 10*3/uL (ref 4.0–10.5)

## 2012-09-24 LAB — URINALYSIS, ROUTINE W REFLEX MICROSCOPIC
Bilirubin Urine: NEGATIVE
Glucose, UA: NEGATIVE mg/dL
Hgb urine dipstick: NEGATIVE
Specific Gravity, Urine: 1.023 (ref 1.005–1.030)
Urobilinogen, UA: 0.2 mg/dL (ref 0.0–1.0)

## 2012-09-24 LAB — GLUCOSE, CAPILLARY
Glucose-Capillary: 166 mg/dL — ABNORMAL HIGH (ref 70–99)
Glucose-Capillary: 140 mg/dL — ABNORMAL HIGH (ref 70–99)

## 2012-09-24 SURGERY — BYPASS GRAFT FEMORAL-POPLITEAL ARTERY
Anesthesia: General | Site: Toe | Laterality: Left | Wound class: Clean

## 2012-09-24 MED ORDER — GLUCERNA SHAKE PO LIQD
237.0000 mL | Freq: Three times a day (TID) | ORAL | Status: DC
  Administered 2012-09-25 – 2012-09-27 (×5): 237 mL via ORAL

## 2012-09-24 MED ORDER — ACETAMINOPHEN 325 MG PO TABS: 325.0000 mg | ORAL_TABLET | ORAL | Status: DC | PRN

## 2012-09-24 MED ORDER — LABETALOL HCL 5 MG/ML IV SOLN
10.0000 mg | INTRAVENOUS | Status: DC | PRN
Start: 2012-09-24 — End: 2012-09-27
  Filled 2012-09-24: qty 4

## 2012-09-24 MED ORDER — IOHEXOL 300 MG/ML  SOLN
INTRAMUSCULAR | Status: DC | PRN
  Administered 2012-09-24: 50 mL via INTRA_ARTERIAL

## 2012-09-24 MED ORDER — ATORVASTATIN CALCIUM 40 MG PO TABS
40.0000 mg | ORAL_TABLET | Freq: Every day | ORAL | Status: DC
  Administered 2012-09-24 – 2012-09-26 (×3): 40 mg via ORAL
  Filled 2012-09-24 (×4): qty 1

## 2012-09-24 MED ORDER — NEOSTIGMINE METHYLSULFATE 1 MG/ML IJ SOLN
INTRAMUSCULAR | Status: DC | PRN
  Administered 2012-09-24: 2 mg via INTRAVENOUS

## 2012-09-24 MED ORDER — LACTATED RINGERS IV SOLN
INTRAVENOUS | Status: DC | PRN
  Administered 2012-09-24 (×3): via INTRAVENOUS

## 2012-09-24 MED ORDER — HALOPERIDOL 0.5 MG PO TABS
0.5000 mg | ORAL_TABLET | Freq: Every morning | ORAL | Status: DC
  Administered 2012-09-25 – 2012-09-26 (×2): 0.5 mg via ORAL
  Filled 2012-09-24 (×4): qty 1

## 2012-09-24 MED ORDER — SODIUM CHLORIDE 0.9 % IV SOLN
10.0000 mg | INTRAVENOUS | Status: DC | PRN
  Administered 2012-09-24: 40 ug/min via INTRAVENOUS

## 2012-09-24 MED ORDER — DILTIAZEM HCL ER COATED BEADS 120 MG PO CP24
120.0000 mg | ORAL_CAPSULE | Freq: Every day | ORAL | Status: DC
  Administered 2012-09-25 – 2012-09-27 (×3): 120 mg via ORAL
  Filled 2012-09-24 (×3): qty 1

## 2012-09-24 MED ORDER — MIRTAZAPINE 15 MG PO TABS
15.0000 mg | ORAL_TABLET | Freq: Every day | ORAL | Status: DC
  Administered 2012-09-24 – 2012-09-26 (×3): 15 mg via ORAL
  Filled 2012-09-24 (×4): qty 1

## 2012-09-24 MED ORDER — ACETAMINOPHEN 650 MG RE SUPP: 325.0000 mg | RECTAL | Status: DC | PRN

## 2012-09-24 MED ORDER — ARTIFICIAL TEARS OP OINT
TOPICAL_OINTMENT | OPHTHALMIC | Status: DC | PRN
  Administered 2012-09-24: 1 via OPHTHALMIC

## 2012-09-24 MED ORDER — DOCUSATE SODIUM 100 MG PO CAPS
100.0000 mg | ORAL_CAPSULE | Freq: Every day | ORAL | Status: DC
  Administered 2012-09-25 – 2012-09-27 (×3): 100 mg via ORAL
  Filled 2012-09-24 (×3): qty 1

## 2012-09-24 MED ORDER — MORPHINE SULFATE 2 MG/ML IJ SOLN: 2.0000 mg | INTRAMUSCULAR | Status: DC | PRN

## 2012-09-24 MED ORDER — ONDANSETRON HCL 4 MG/2ML IJ SOLN: 4.0000 mg | Freq: Four times a day (QID) | INTRAMUSCULAR | Status: DC | PRN

## 2012-09-24 MED ORDER — PHENOL 1.4 % MT LIQD: 1.0000 | OROMUCOSAL | Status: DC | PRN

## 2012-09-24 MED ORDER — ONDANSETRON HCL 4 MG/2ML IJ SOLN
INTRAMUSCULAR | Status: DC | PRN
  Administered 2012-09-24: 4 mg via INTRAVENOUS

## 2012-09-24 MED ORDER — DOPAMINE-DEXTROSE 3.2-5 MG/ML-% IV SOLN: 3.0000 ug/kg/min | INTRAVENOUS | Status: DC

## 2012-09-24 MED ORDER — PHENYLEPHRINE HCL 10 MG/ML IJ SOLN
INTRAMUSCULAR | Status: DC | PRN
  Administered 2012-09-24 (×2): 160 ug via INTRAVENOUS

## 2012-09-24 MED ORDER — PANTOPRAZOLE SODIUM 40 MG PO TBEC
40.0000 mg | DELAYED_RELEASE_TABLET | Freq: Every day | ORAL | Status: DC
  Administered 2012-09-24 – 2012-09-27 (×4): 40 mg via ORAL
  Filled 2012-09-24 (×4): qty 1

## 2012-09-24 MED ORDER — GLYCOPYRROLATE 0.2 MG/ML IJ SOLN
INTRAMUSCULAR | Status: DC | PRN
Start: 2012-09-24 — End: 2012-09-24
  Administered 2012-09-24: 0.2 mg via INTRAVENOUS
  Administered 2012-09-24: 0.4 mg via INTRAVENOUS

## 2012-09-24 MED ORDER — SODIUM CHLORIDE 0.9 % IV SOLN
500.0000 mL | Freq: Once | INTRAVENOUS | Status: AC | PRN
  Administered 2012-09-24: 500 mL via INTRAVENOUS

## 2012-09-24 MED ORDER — METFORMIN HCL 500 MG PO TABS
1000.0000 mg | ORAL_TABLET | Freq: Two times a day (BID) | ORAL | Status: DC
  Administered 2012-09-24 – 2012-09-27 (×6): 1000 mg via ORAL
  Filled 2012-09-24 (×8): qty 2

## 2012-09-24 MED ORDER — HALOPERIDOL 1 MG PO TABS
1.0000 mg | ORAL_TABLET | Freq: Every day | ORAL | Status: DC
  Administered 2012-09-24 – 2012-09-26 (×3): 1 mg via ORAL
  Filled 2012-09-24 (×4): qty 1

## 2012-09-24 MED ORDER — LIDOCAINE HCL (CARDIAC) 20 MG/ML IV SOLN
INTRAVENOUS | Status: DC | PRN
  Administered 2012-09-24: 60 mg via INTRAVENOUS

## 2012-09-24 MED ORDER — LISINOPRIL 10 MG PO TABS
10.0000 mg | ORAL_TABLET | Freq: Every day | ORAL | Status: DC
Start: 2012-09-24 — End: 2012-09-27
  Administered 2012-09-24 – 2012-09-27 (×4): 10 mg via ORAL
  Filled 2012-09-24 (×4): qty 1

## 2012-09-24 MED ORDER — GUAIFENESIN-DM 100-10 MG/5ML PO SYRP: 15.0000 mL | ORAL_SOLUTION | ORAL | Status: DC | PRN

## 2012-09-24 MED ORDER — DEXTROSE 5 % IV SOLN
1.5000 g | Freq: Two times a day (BID) | INTRAVENOUS | Status: AC
  Administered 2012-09-24 – 2012-09-25 (×2): 1.5 g via INTRAVENOUS
  Filled 2012-09-24 (×2): qty 1.5

## 2012-09-24 MED ORDER — PAPAVERINE HCL 30 MG/ML IJ SOLN
INTRAMUSCULAR | Status: AC
  Filled 2012-09-24: qty 2

## 2012-09-24 MED ORDER — PROTAMINE SULFATE 10 MG/ML IV SOLN
INTRAVENOUS | Status: DC | PRN
  Administered 2012-09-24 (×2): 20 mg via INTRAVENOUS
  Administered 2012-09-24 (×2): 10 mg via INTRAVENOUS
  Administered 2012-09-24 (×2): 20 mg via INTRAVENOUS

## 2012-09-24 MED ORDER — CLOPIDOGREL BISULFATE 75 MG PO TABS
75.0000 mg | ORAL_TABLET | Freq: Every day | ORAL | Status: DC
  Administered 2012-09-25 – 2012-09-27 (×3): 75 mg via ORAL
  Filled 2012-09-24 (×3): qty 1

## 2012-09-24 MED ORDER — POTASSIUM CHLORIDE CRYS ER 20 MEQ PO TBCR: 20.0000 meq | EXTENDED_RELEASE_TABLET | Freq: Once | ORAL | Status: AC | PRN

## 2012-09-24 MED ORDER — 0.9 % SODIUM CHLORIDE (POUR BTL) OPTIME
TOPICAL | Status: DC | PRN
  Administered 2012-09-24: 2000 mL

## 2012-09-24 MED ORDER — ENOXAPARIN SODIUM 30 MG/0.3ML ~~LOC~~ SOLN
30.0000 mg | SUBCUTANEOUS | Status: DC
  Administered 2012-09-25 – 2012-09-26 (×2): 30 mg via SUBCUTANEOUS
  Filled 2012-09-24 (×3): qty 0.3

## 2012-09-24 MED ORDER — METOPROLOL TARTRATE 1 MG/ML IV SOLN: 2.0000 mg | INTRAVENOUS | Status: DC | PRN

## 2012-09-24 MED ORDER — CARVEDILOL 6.25 MG PO TABS
6.2500 mg | ORAL_TABLET | Freq: Two times a day (BID) | ORAL | Status: DC
  Administered 2012-09-24 – 2012-09-27 (×6): 6.25 mg via ORAL
  Filled 2012-09-24 (×8): qty 1

## 2012-09-24 MED ORDER — SODIUM CHLORIDE 0.9 % IR SOLN
Status: DC | PRN
  Administered 2012-09-24: 08:00:00

## 2012-09-24 MED ORDER — OXYCODONE HCL 5 MG PO TABS
5.0000 mg | ORAL_TABLET | ORAL | Status: DC | PRN
  Administered 2012-09-24: 10 mg via ORAL
  Filled 2012-09-24: qty 2

## 2012-09-24 MED ORDER — SODIUM CHLORIDE 0.9 % IV SOLN: INTRAVENOUS | Status: DC

## 2012-09-24 MED ORDER — ALUM & MAG HYDROXIDE-SIMETH 200-200-20 MG/5ML PO SUSP: 15.0000 mL | ORAL | Status: DC | PRN

## 2012-09-24 MED ORDER — THROMBIN 20000 UNITS EX SOLR
CUTANEOUS | Status: AC
  Filled 2012-09-24: qty 20000

## 2012-09-24 MED ORDER — PROPOFOL 10 MG/ML IV BOLUS
INTRAVENOUS | Status: DC | PRN
  Administered 2012-09-24: 120 mg via INTRAVENOUS

## 2012-09-24 MED ORDER — ROCURONIUM BROMIDE 100 MG/10ML IV SOLN
INTRAVENOUS | Status: DC | PRN
  Administered 2012-09-24: 20 mg via INTRAVENOUS
  Administered 2012-09-24: 10 mg via INTRAVENOUS
  Administered 2012-09-24: 20 mg via INTRAVENOUS
  Administered 2012-09-24: 50 mg via INTRAVENOUS

## 2012-09-24 MED ORDER — ACETAMINOPHEN 500 MG PO TABS
1000.0000 mg | ORAL_TABLET | Freq: Three times a day (TID) | ORAL | Status: DC
  Administered 2012-09-24 – 2012-09-25 (×3): 1000 mg via ORAL
  Filled 2012-09-24 (×5): qty 2

## 2012-09-24 MED ORDER — ASPIRIN EC 325 MG PO TBEC
325.0000 mg | DELAYED_RELEASE_TABLET | Freq: Every day | ORAL | Status: DC
  Administered 2012-09-25 – 2012-09-27 (×3): 325 mg via ORAL
  Filled 2012-09-24 (×3): qty 1

## 2012-09-24 MED ORDER — FENTANYL CITRATE 0.05 MG/ML IJ SOLN
INTRAMUSCULAR | Status: DC | PRN
  Administered 2012-09-24 (×5): 50 ug via INTRAVENOUS
  Administered 2012-09-24: 100 ug via INTRAVENOUS
  Administered 2012-09-24: 50 ug via INTRAVENOUS

## 2012-09-24 MED ORDER — HEPARIN SODIUM (PORCINE) 1000 UNIT/ML IJ SOLN
INTRAMUSCULAR | Status: DC | PRN
  Administered 2012-09-24: 10000 [IU] via INTRAVENOUS

## 2012-09-24 MED ORDER — SODIUM CHLORIDE 0.9 % IV SOLN
INTRAVENOUS | Status: DC
  Administered 2012-09-24 (×2): via INTRAVENOUS

## 2012-09-24 MED ORDER — HYDRALAZINE HCL 20 MG/ML IJ SOLN: 10.0000 mg | INTRAMUSCULAR | Status: DC | PRN

## 2012-09-24 MED ORDER — SERTRALINE HCL 50 MG PO TABS
50.0000 mg | ORAL_TABLET | Freq: Every day | ORAL | Status: DC
Start: 2012-09-24 — End: 2012-09-27
  Administered 2012-09-24 – 2012-09-27 (×4): 50 mg via ORAL
  Filled 2012-09-24 (×4): qty 1

## 2012-09-24 SURGICAL SUPPLY — 86 items
BANDAGE ESMARK 6X9 LF (GAUZE/BANDAGES/DRESSINGS) IMPLANT
BANDAGE GAUZE ELAST BULKY 4 IN (GAUZE/BANDAGES/DRESSINGS) ×3 IMPLANT
BLADE SAW SGTL MED 73X18.5 STR (BLADE) IMPLANT
BNDG COHESIVE 4X5 TAN STRL (GAUZE/BANDAGES/DRESSINGS) ×3 IMPLANT
BNDG COHESIVE 6X5 TAN STRL LF (GAUZE/BANDAGES/DRESSINGS) ×3 IMPLANT
BNDG ESMARK 6X9 LF (GAUZE/BANDAGES/DRESSINGS)
CANISTER SUCTION 2500CC (MISCELLANEOUS) ×3 IMPLANT
CLIP TI MEDIUM 24 (CLIP) ×3 IMPLANT
CLIP TI WIDE RED SMALL 24 (CLIP) ×3 IMPLANT
CLOTH BEACON ORANGE TIMEOUT ST (SAFETY) ×3 IMPLANT
CORONARY SUCKER SOFT TIP 10052 (MISCELLANEOUS) IMPLANT
COVER SURGICAL LIGHT HANDLE (MISCELLANEOUS) ×3 IMPLANT
CUFF TOURNIQUET SINGLE 24IN (TOURNIQUET CUFF) IMPLANT
CUFF TOURNIQUET SINGLE 34IN LL (TOURNIQUET CUFF) IMPLANT
CUFF TOURNIQUET SINGLE 44IN (TOURNIQUET CUFF) IMPLANT
DECANTER SPIKE VIAL GLASS SM (MISCELLANEOUS) IMPLANT
DERMABOND ADVANCED (GAUZE/BANDAGES/DRESSINGS) ×2
DERMABOND ADVANCED .7 DNX12 (GAUZE/BANDAGES/DRESSINGS) ×4 IMPLANT
DRAIN SNY WOU (WOUND CARE) IMPLANT
DRAPE U-SHAPE 47X51 STRL (DRAPES) IMPLANT
DRAPE WARM FLUID 44X44 (DRAPE) ×3 IMPLANT
DRAPE X-RAY CASS 24X20 (DRAPES) ×3 IMPLANT
DRSG ADAPTIC 3X8 NADH LF (GAUZE/BANDAGES/DRESSINGS) ×3 IMPLANT
DRSG COVADERM 4X10 (GAUZE/BANDAGES/DRESSINGS) ×3 IMPLANT
DRSG COVADERM 4X8 (GAUZE/BANDAGES/DRESSINGS) ×3 IMPLANT
DRSG PAD ABDOMINAL 8X10 ST (GAUZE/BANDAGES/DRESSINGS) ×3 IMPLANT
DURAPREP 26ML APPLICATOR (WOUND CARE) IMPLANT
ELECT REM PT RETURN 9FT ADLT (ELECTROSURGICAL) ×3
ELECTRODE REM PT RTRN 9FT ADLT (ELECTROSURGICAL) ×2 IMPLANT
EVACUATOR SILICONE 100CC (DRAIN) IMPLANT
GLOVE BIO SURGEON STRL SZ 6.5 (GLOVE) ×6 IMPLANT
GLOVE BIO SURGEON STRL SZ7.5 (GLOVE) ×3 IMPLANT
GLOVE BIOGEL PI IND STRL 6.5 (GLOVE) ×8 IMPLANT
GLOVE BIOGEL PI IND STRL 9 (GLOVE) ×2 IMPLANT
GLOVE BIOGEL PI INDICATOR 6.5 (GLOVE) ×4
GLOVE BIOGEL PI INDICATOR 9 (GLOVE) ×1
GLOVE ECLIPSE 6.5 STRL STRAW (GLOVE) ×3 IMPLANT
GLOVE SURG ORTHO 9.0 STRL STRW (GLOVE) ×3 IMPLANT
GLOVE SURG SS PI 6.5 STRL IVOR (GLOVE) ×3 IMPLANT
GLOVE SURG SS PI 7.0 STRL IVOR (GLOVE) ×9 IMPLANT
GOWN PREVENTION PLUS XLARGE (GOWN DISPOSABLE) ×6 IMPLANT
GOWN SRG XL XLNG 56XLVL 4 (GOWN DISPOSABLE) ×2 IMPLANT
GOWN STRL NON-REIN LRG LVL3 (GOWN DISPOSABLE) ×9 IMPLANT
GOWN STRL NON-REIN XL XLG LVL4 (GOWN DISPOSABLE) ×1
GOWN STRL REIN XL XLG (GOWN DISPOSABLE) ×6 IMPLANT
KIT BASIN OR (CUSTOM PROCEDURE TRAY) ×3 IMPLANT
KIT ROOM TURNOVER OR (KITS) ×3 IMPLANT
LOOP VESSEL MAXI BLUE (MISCELLANEOUS) ×3 IMPLANT
MANIFOLD NEPTUNE II (INSTRUMENTS) IMPLANT
MARKER GRAFT CORONARY BYPASS (MISCELLANEOUS) IMPLANT
NS IRRIG 1000ML POUR BTL (IV SOLUTION) ×6 IMPLANT
PACK ORTHO EXTREMITY (CUSTOM PROCEDURE TRAY) IMPLANT
PACK PERIPHERAL VASCULAR (CUSTOM PROCEDURE TRAY) ×3 IMPLANT
PAD ARMBOARD 7.5X6 YLW CONV (MISCELLANEOUS) ×6 IMPLANT
PAD CAST 4YDX4 CTTN HI CHSV (CAST SUPPLIES) IMPLANT
PADDING CAST COTTON 4X4 STRL (CAST SUPPLIES)
PADDING CAST COTTON 6X4 STRL (CAST SUPPLIES) IMPLANT
SET COLLECT BLD 21X3/4 12 (NEEDLE) ×3 IMPLANT
SPONGE GAUZE 4X4 12PLY (GAUZE/BANDAGES/DRESSINGS) ×3 IMPLANT
SPONGE LAP 18X18 X RAY DECT (DISPOSABLE) ×3 IMPLANT
SPONGE SURGIFOAM ABS GEL 100 (HEMOSTASIS) IMPLANT
STAPLER VISISTAT 35W (STAPLE) ×6 IMPLANT
STOCKINETTE IMPERVIOUS LG (DRAPES) IMPLANT
STOPCOCK 4 WAY LG BORE MALE ST (IV SETS) ×3 IMPLANT
SUCTION FRAZIER TIP 10 FR DISP (SUCTIONS) IMPLANT
SUT ETHILON 2 0 PSLX (SUTURE) ×6 IMPLANT
SUT PROLENE 5 0 C 1 24 (SUTURE) ×6 IMPLANT
SUT PROLENE 6 0 CC (SUTURE) ×6 IMPLANT
SUT PROLENE 7 0 BV 1 (SUTURE) ×9 IMPLANT
SUT PROLENE 7 0 BV1 MDA (SUTURE) IMPLANT
SUT SILK 2 0 SH (SUTURE) ×3 IMPLANT
SUT SILK 3 0 (SUTURE)
SUT SILK 3-0 18XBRD TIE 12 (SUTURE) IMPLANT
SUT VIC AB 2-0 CTX 36 (SUTURE) ×3 IMPLANT
SUT VIC AB 3-0 SH 27 (SUTURE) ×4
SUT VIC AB 3-0 SH 27X BRD (SUTURE) ×8 IMPLANT
SUT VIC AB 4-0 PS2 27 (SUTURE) ×6 IMPLANT
SUT VICRYL 4-0 PS2 18IN ABS (SUTURE) ×6 IMPLANT
TAPE UMBILICAL COTTON 1/8X30 (MISCELLANEOUS) IMPLANT
TOWEL OR 17X24 6PK STRL BLUE (TOWEL DISPOSABLE) ×6 IMPLANT
TOWEL OR 17X26 10 PK STRL BLUE (TOWEL DISPOSABLE) ×9 IMPLANT
TRAY FOLEY CATH 14FRSI W/METER (CATHETERS) ×3 IMPLANT
TUBE CONNECTING 12X1/4 (SUCTIONS) IMPLANT
TUBING EXTENTION W/L.L. (IV SETS) ×3 IMPLANT
UNDERPAD 30X30 INCONTINENT (UNDERPADS AND DIAPERS) ×3 IMPLANT
WATER STERILE IRR 1000ML POUR (IV SOLUTION) ×3 IMPLANT

## 2012-09-24 NOTE — Progress Notes (Signed)
09/24/12 1900  Vitals  BP ! 90/53 mmHg  500 bolus ns given per md order

## 2012-09-24 NOTE — H&P (Signed)
Patient is a 79 year old gentleman with peripheral vascular disease with ulceration and osteomyelitis left foot first metatarsal. Patient presents at this time for revascularization with Dr. fields for the left lower extremity. Plan for left foot first ray amputation after revascularization procedure. Risks and benefits of surgery were discussed with the patient he states he understands and wished to proceed at this time.

## 2012-09-24 NOTE — Progress Notes (Signed)
Utilization review completed.  

## 2012-09-24 NOTE — Op Note (Signed)
OPERATIVE REPORT  DATE OF SURGERY: 09/24/2012  PATIENT:  Henry Mcdaniel.,  79 y.o. male  PRE-OPERATIVE DIAGNOSIS:  Peripheral Vascular Disease with osteomyelitis of left first toe  POST-OPERATIVE DIAGNOSIS:  Peripheral Vascular Disease with osteomyelitis of left first toe  PROCEDURE:  Procedure(s): BYPASS GRAFT FEMORAL-POPLITEAL ARTERY LEFT FOOT FIRST RAY AMPUTATION  SURGEON:  Surgeon(s): Nadara Mustard, MD Sherren Kerns, MD  ANESTHESIA:   general  EBL:  min ML  SPECIMEN:  Source of Specimen:  Left great toe  TOURNIQUET:  * No tourniquets in log *  PROCEDURE DETAILS: Patient is a 79 year old gentleman with peripheral vascular disease who presents at this time for revascularization the left lower extremity. Patient also has chronic osteomyelitis of the left great toe and after revascularization patient presents for a great toe amputation through the MTP joint. Risks and benefits were discussed including difficulty with healing. Patient states he understands was pursued this time. Description of procedure patient was taught the operating room and underwent revascularization with Dr. Darrick Penna. After revascularization was complete I presented for amputation of the great toe. A fishmouth incision was made just distal to the MTP joint. The toe was amputated through the MTP joint. The wound was irrigated there was a small amount petechial bleeding incision was closed using 2-0 nylon. The wound was covered with Adaptic orthopedic sponges AB dressing Kerlix and Coban. Patient was extubated taken to the PACU in stable condition.  PLAN OF CARE: Admit to inpatient   PATIENT DISPOSITION:  PACU - hemodynamically stable.   Nadara Mustard, MD 09/24/2012 11:48 AM

## 2012-09-24 NOTE — Preoperative (Signed)
Beta Blockers   Reason not to administer Beta Blockers:Not Applicable 

## 2012-09-24 NOTE — Anesthesia Procedure Notes (Signed)
Procedure Name: Intubation Date/Time: 09/24/2012 7:48 AM Performed by: De Nurse Pre-anesthesia Checklist: Patient identified, Emergency Drugs available, Suction available, Patient being monitored and Timeout performed Patient Re-evaluated:Patient Re-evaluated prior to inductionOxygen Delivery Method: Circle system utilized Preoxygenation: Pre-oxygenation with 100% oxygen Intubation Type: IV induction Ventilation: Mask ventilation without difficulty and Oral airway inserted - appropriate to patient size Laryngoscope Size: Mac and 3 Grade View: Grade I Tube type: Oral Tube size: 7.5 mm Number of attempts: 1 Airway Equipment and Method: Stylet Placement Confirmation: ETT inserted through vocal cords under direct vision,  positive ETCO2 and breath sounds checked- equal and bilateral Secured at: 23 cm Tube secured with: Tape Dental Injury: Teeth and Oropharynx as per pre-operative assessment

## 2012-09-24 NOTE — Telephone Encounter (Addendum)
Message copied by Fredrich Birks on Mon Sep 24, 2012  4:04 PM ------      Message from: [place] K      Created: Mon Sep 24, 2012  2:49 PM      Regarding: schedule                   ----- Message -----         From: Dara Lords, PA-C         Sent: 09/24/2012   2:11 PM           To: Sharee Pimple, CMA            S/p Left femoral to above knee popliteal bypass with non-reversed ipsilateral great saphenous vein, intraop arteriogram on 09/24/12.  F/u with Dr. Darrick Penna in 2 weeks.            Thanks,      Samantha ------  09/24/12: spoke with [facility]OXWRU @ [facility]

## 2012-09-24 NOTE — H&P (Signed)
[facility] [place]  History and Physical  History of Present Illness: Patient is a 79 y.o. year old male who presents for follow up evaluation of Osteomyelitis of his left first toe. The patient is referred by Dr. [name]. The patient has moderate dementia. Most of the history was provided by his niece. The patient apparently has had drainage from his left first toe for approximately 2 years. He denies any rest pain in the left foot. He states that the area of the toe is sore to palpation. He is a former smoker but quit in April of last year. Dr. [name] has recommended amputation of his left first toe for osteomyelitis. However the patient did not have a normal vascular exam and was referred to [place] for possible revascularization. He returns today for further evaluation after a CT angiogram with lower extremity runoff. He also has a history of coronary artery disease with a myocardial infarction in July 2013. This was in [facility]. He has also been depressed over the last year and has had 40 pound weight loss. Other medical problems include diabetes, hypertension, coronary disease, hyperlipidemia, "black lung disease". These are currently stable. The patient's niece says that his dementia has been stable over the past year. He is able to perform his daily activities. He is ambulatory. He is on doxycycline.  Past Medical History   Diagnosis  Date   .  Peripheral vascular disease    .  Diabetes mellitus without complication    .  Hypertension    .  Myocardial infarction    .  Hyperlipidemia    .  Black lung disease    .  Carotid artery occlusion     Past Surgical History   Procedure  Laterality  Date   .  Carotid endarterectomy  Left    .  Hernia repair     abdominal operation unknown procedure  Social History  History   Substance Use Topics   .  Smoking status:  Former Smoker     Types:  Cigarettes     Quit date:  06/07/2011   .  Smokeless tobacco:  Never Used   .   Alcohol Use:  No   Family History  History reviewed. No pertinent family history.  Allergies  No Known Allergies  Current Outpatient Prescriptions   Medication  Sig  Dispense  Refill   .  Acetaminophen (Q-PAP PO)  Take 500 mg by mouth 2 (two) times daily.     Marland Kitchen  aspirin 325 MG tablet  Take 325 mg by mouth daily.     Marland Kitchen  atorvastatin (LIPITOR) 80 MG tablet  Take 80 mg by mouth daily.     .  carvedilol (COREG) 12.5 MG tablet  Take 12.5 mg by mouth. Take 1/2 tablet every 12 hours     .  clopidogrel (PLAVIX) 75 MG tablet  Take 75 mg by mouth daily.     Marland Kitchen  diltiazem (CARDIZEM) 120 MG tablet  Take 120 mg by mouth daily.     Marland Kitchen  doxycycline (VIBRAMYCIN) 100 MG capsule  Take 100 mg by mouth 2 (two) times daily.     .  Glucerna (GLUCERNA) LIQD  Take 237 mLs by mouth. Drink 1 shake 1-3 times per day     .  haloperidol (HALDOL) 0.5 MG tablet  Take 0.5 mg by mouth 2 (two) times daily.     Marland Kitchen  lisinopril (PRINIVIL,ZESTRIL) 20 MG tablet  Take 20 mg  by mouth daily. Take 1/2 tablet daily     .  LORazepam (ATIVAN) 0.5 MG tablet  Take 0.5 mg by mouth every 6 (six) hours as needed for anxiety.     .  metFORMIN (GLUCOPHAGE) 1000 MG tablet  Take 1,000 mg by mouth daily.     .  methocarbamol (ROBAXIN) 500 MG tablet  Take 500 mg by mouth 2 (two) times daily as needed.     .  mirtazapine (REMERON) 15 MG tablet  Take 15 mg by mouth at bedtime.     .  sertraline (ZOLOFT) 50 MG tablet  Take 50 mg by mouth daily.      No current facility-administered medications for this visit.   Physical Examination  Filed Vitals:   09/24/12 0609  BP: 131/84  Pulse: 64  Temp: 97 F (36.1 C)  TempSrc: Oral  Resp: 18  SpO2: 100%   General: Alert and oriented, no acute distress  HEENT: Normal  Neck: No bruit or JVD, well-healed left neck scar  Pulmonary: Clear to auscultation bilaterally  Cardiac: Regular Rate and Rhythm without murmur  Abdomen: Soft, non-tender, non-distended, no mass, well-healed midline laparotomy scar  with well-healed vertical incisions each groin  Skin: No rash, 1 cm ulceration tip of left first toe with clear drainage no significant change from previous office visit  Extremity Pulses: 2+ radial, brachial, femoral, absent dorsalis pedis, posterior tibial pulses bilaterally  Musculoskeletal: No deformity or edema  Neurologic: Upper and lower extremity motor 5/5 and symmetric  DATA: Prior ABIs were reviewed ABI on the left was 0.52 right was 0.66. CT angiogram of the abdomen and pelvis with lower extremity runoff is reviewed. The patient has a patent aortobifemoral bypass graft. The right superficial femoral artery is occluded. There is reconstitution of the distal popliteal artery with three-vessel runoff. He has subtotal occlusion of the left superficial femoral artery with one-vessel runoff via the left peroneal. There is an old compression fracture of L1 vertebral body. He has a 50% right renal artery stenosis. He also was noted to have a 5 mm right middle lobe lung nodule which will need a followup CT scan in one year.   ASSESSMENT: Osteomyelitis left first toe with marginal perfusion of the left lower extremity. patient with prior aortobifemoral bypass which would make angioplasty or stenting of his superficial femoral artery technically difficult. Left fem pop bypass today. At the time of his bypass procedure we could arrange for Dr. [name] to do the left first toe amputation. Risks benefits possible complications and procedure details were trying to the patient and his family member today. The risks benefits and complications of angiogram as well as left femoropopliteal bypass were discussed.   Plan: See above  Fabienne Bruns, MD  [facility] [place]  Office: [phone]  Pager: [phone]

## 2012-09-24 NOTE — Transfer of Care (Signed)
Immediate Anesthesia Transfer of Care Note  Patient: Henry Mcdaniel.  Procedure(s) Performed: Procedure(s): BYPASS GRAFT FEMORAL-POPLITEAL ARTERY (Left) LEFT FOOT FIRST RAY AMPUTATION (Left)  Patient Location: PACU  Anesthesia Type:General  Level of Consciousness: awake, alert  and oriented  Airway & Oxygen Therapy: Patient Spontanous Breathing and Patient connected to face mask oxygen  Post-op Assessment: Report given to PACU RN and Post -op Vital signs reviewed and stable  Post vital signs: Reviewed and stable  Complications: No apparent anesthesia complications

## 2012-09-24 NOTE — Anesthesia Preprocedure Evaluation (Addendum)
Anesthesia Evaluation  Patient identified by MRN, date of birth, ID band Patient confused  General Assessment Comment:Pt not able to tell me what surgery is to be done  Reviewed: Allergy & Precautions, H&P , NPO status   History of Anesthesia Complications Negative for: history of anesthetic complications  Airway Mallampati: II  Neck ROM: Full    Dental  (+) Edentulous Upper, Edentulous Lower and Dental Advisory Given   Pulmonary    + decreased breath sounds      Cardiovascular hypertension, Pt. on home beta blockers + CAD, + Past MI and + Peripheral Vascular Disease Rhythm:Regular Rate:Normal     Neuro/Psych Some dementia    GI/Hepatic negative GI ROS, Neg liver ROS,   Endo/Other  diabetes, Type 2  Renal/GU      Musculoskeletal   Abdominal (+) + obese,   Peds  Hematology   Anesthesia Other Findings   Reproductive/Obstetrics                         Anesthesia Physical Anesthesia Plan  ASA: IV  Anesthesia Plan: General   Post-op Pain Management:    Induction: Intravenous  Airway Management Planned: Oral ETT  Additional Equipment: Arterial line  Intra-op Plan:   Post-operative Plan: Extubation in OR  Informed Consent:   Plan Discussed with:   Anesthesia Plan Comments:         Anesthesia Quick Evaluation

## 2012-09-24 NOTE — Op Note (Signed)
Procedure: Left femoral to above knee popliteal bypass with non-reversed ipsilateral great saphenous vein, intraop arteriogram  Preoperative diagnosis: Non healing wound left first toe  Postoperative diagnosis: Same  Anesthesia: General  Asst.: Doreatha Massed, PA-C  Operative findings:     good quality saphenous vein 3-4 mm, 1 vessel peroneal runoff  Operative details: After obtaining informed consent, the patient was taken to the operating room. The patient was placed in supine position on the operating room table. After induction of general anesthesia and endotracheal intubation, a Foley catheter was placed. Next, the patient's entire left lower extremity was prepped and draped in the usual sterile fashion. A longitudinal incision was then made in the left groin and carried down through the subcutaneous tissues to expose the left common femoral artery.  The patient had a prior aorto bifemoral bypass so the was some scar which made dissection fairly tedious.  The femoral vein was adherent to the medial side of the artery.  The was dense scar at the femoral bifurcation but I was able to mobilize the superficial femoral and profunda femoris to provide adequate area to clamp.   The common femoral artery was dissected free circumferentially at the inguinal ligament as well as the left limb of the aortobifem.  The aortobifem was PTFE material.   There was a pulse within the common femoral artery. The distal external iliac artery was dissected free circumferentially underneath the inguinal ligament.  A vessel loop was also placed around the distal external iliac artery.   Next the saphenofemoral junction was identified in the medial portion of the groin incision and this was harvested through several skip incisions on the medial aspect of the leg.  Side branches were ligated and divided between silk ties or clips.  The vein was of good quality 3-4 mm diameter  The vein harvest incision was deepened into  the fascia at the above knee segment and the above knee popliteal space was entered.  The popliteal artery was dissected free circumferentially.  It was calcified in islands but was clampable.  A tunnel was then created subsubsartorial up to the groin.  The vein was ligated distally and at the saphenofemoral junction oversewn with a 5 0 prolene.  The vein was gently distended with heparinized saline and inspected for hemostasis.    The patient was given 10000 units of heparin.  After appropriate circulation time, the distal right external iliac artery was controlled with a vessel loop and the aorto fem controlled with a Henley clamp. The proximal superficial femoral artery was controlled with a fistula clamp and the profunda with a vessel loop.   A longitudinal opening was made in the hood of the aortofemoral limb.  The vein was placed in a non reversed configuration.  The arteriotomy was extended with Pott's scissors.  The vein was spatulated and sewn end to side to the PTFE using a running 5 0 Prolene.  Just prior to completion of the anastomosis everything was forebled backbled and thoroughly flushed. Proximal clamp and distal clamps were removed and there was good pulsatile flow in the profunda femoris artery immediately. The SFA was chronically occluded.    The graft was then brought through the subsartorial tunnel down to the above-knee popliteal artery after marking for orientation. The above-knee popliteal artery was controlled proximally and distally with a Henley clamp. A longitudinal opening was made in the distal above-knee popliteal artery in an area that was fairly free of calcification. The graft was then cut to  length and spatulated and sewn end of graft to side of artery using running 6-0 Prolene suture.  At completion of the anastomosis everything was forebled backbled and thoroughly flushed. The remainder of the anastomosis was completed and all clamps were removed restoring pulsatile flow to  the above-knee popliteal artery. An intraoperative arteriogram was obtained with inflow occlusion.  A 21 gauge butterfly needle was placed in the proximal vein graft and contrast injected.  The distal anastomosis was patent with 1 vessel peroneal runoff.  The patient had monophasic to biphasic Doppler flow in the posterior tibial and dorsalis pedis areas of the foot.  One repair stitch was placed in the lateral wall of the proximal anastomosis and at the medial heel of the distal anastomosis.   The heparin was fully reversed with protamine.  After hemostasis was obtained, the deep layers and subcutaneous layers of the above-knee popliteal incision were closed with running 3-0 Vicryl suture. The skin was closed with staples.   The saphenectomy incisions were closed with running 3 0 vicryl follow by staples.  The groin was inspected and found to be hemostatic. This was then closed in multiple layers of running 2 0 and 3-0 Vicryl suture and 4-0 subcuticular stitch. The patient tolerated the procedure well and there were no complications. Instrument sponge and needle counts were correct at the end of the case.   At this point Dr [name] arrived to do a first toe amputation which is dictated separately.  Fabienne Bruns, MD [facility] [place] Office: [phone] Pager: [phone]

## 2012-09-24 NOTE — Progress Notes (Signed)
Orthopedic Tech Progress Note Patient Details:  Henry Mcdaniel. Jun 06, 1934 [id number] Applied Darco shoe to LLE. Ortho Devices Type of Ortho Device: Darco shoe Ortho Device/Splint Interventions: Application   Lesle Chris 09/24/2012, 4:00 PM

## 2012-09-24 NOTE — Progress Notes (Signed)
Spoke with Dr. Darrick Penna who stated it was okay if patient was still taking plavix.  According to [facility] from [place] patient last tokk plavix on 09/23/12.

## 2012-09-24 NOTE — Anesthesia Postprocedure Evaluation (Signed)
  Anesthesia Post-op Note  Patient: Henry Mcdaniel.  Procedure(s) Performed: Procedure(s): BYPASS GRAFT FEMORAL-POPLITEAL ARTERY (Left) LEFT FOOT FIRST RAY AMPUTATION (Left)  Patient Location: PACU  Anesthesia Type:General  Level of Consciousness: awake  Airway and Oxygen Therapy: Patient Spontanous Breathing  Post-op Pain: mild  Post-op Assessment: Post-op Vital signs reviewed and Patient's Cardiovascular Status Stable  Post-op Vital Signs: stable  Complications: No apparent anesthesia complications

## 2012-09-25 ENCOUNTER — Encounter (HOSPITAL_COMMUNITY): Payer: Self-pay | Admitting: Vascular Surgery

## 2012-09-25 LAB — CBC
HCT: 32.2 % — ABNORMAL LOW (ref 39.0–52.0)
MCHC: 33.9 g/dL (ref 30.0–36.0)
Platelets: 128 10*3/uL — ABNORMAL LOW (ref 150–400)
RDW: 13.9 % (ref 11.5–15.5)
WBC: 7 10*3/uL (ref 4.0–10.5)

## 2012-09-25 LAB — BASIC METABOLIC PANEL
BUN: 20 mg/dL (ref 6–23)
Chloride: 104 mEq/L (ref 96–112)
GFR calc Af Amer: 81 mL/min — ABNORMAL LOW (ref >90)
GFR calc non Af Amer: 70 mL/min — ABNORMAL LOW (ref >90)
Potassium: 4.1 mEq/L (ref 3.5–5.1)
Sodium: 137 mEq/L (ref 135–145)

## 2012-09-25 LAB — GLUCOSE, CAPILLARY: Glucose-Capillary: 140 mg/dL — ABNORMAL HIGH (ref 70–99)

## 2012-09-25 LAB — HEMOGLOBIN A1C: Hgb A1c MFr Bld: 6.8 % — ABNORMAL HIGH (ref <5.7)

## 2012-09-25 MED ORDER — OXYCODONE HCL 5 MG PO TABS
5.0000 mg | ORAL_TABLET | ORAL | Status: DC | PRN
  Administered 2012-09-25: 5 mg via ORAL
  Filled 2012-09-25: qty 1

## 2012-09-25 MED ORDER — INSULIN ASPART 100 UNIT/ML ~~LOC~~ SOLN
0.0000 [IU] | Freq: Three times a day (TID) | SUBCUTANEOUS | Status: DC
  Administered 2012-09-25 – 2012-09-26 (×2): 1 [IU] via SUBCUTANEOUS
  Administered 2012-09-26: 2 [IU] via SUBCUTANEOUS
  Administered 2012-09-26 – 2012-09-27 (×2): 1 [IU] via SUBCUTANEOUS
  Administered 2012-09-27: 2 [IU] via SUBCUTANEOUS

## 2012-09-25 MED ORDER — OXYCODONE HCL 5 MG PO TABS: 5.0000 mg | ORAL_TABLET | Freq: Four times a day (QID) | ORAL | Status: AC | PRN

## 2012-09-25 NOTE — Progress Notes (Addendum)
[facility] Progress Note  09/25/2012 7:23 AM 1 Day Post-Op  Subjective:  Not much to say this am.  Had sitter last night  Afebrile x 24 hrs VSS 96% RA  Filed Vitals:   09/25/12 0400  BP:   Pulse:   Temp: 98.4 F (36.9 C)  Resp:     Physical Exam: Incisions:  Left groin incision is c/d/i.  Soft without hematoma Extremities:  + doppler signal in the left PT.  Cannot access left DP.  Left leg is warm.  Bandage in place from left great toe amp.  CBC    Component Value Date/Time   WBC 7.0 09/25/2012 0520   RBC 3.53* 09/25/2012 0520   HGB 10.9* 09/25/2012 0520   HCT 32.2* 09/25/2012 0520   PLT 128* 09/25/2012 0520   MCV 91.2 09/25/2012 0520   MCH 30.9 09/25/2012 0520   MCHC 33.9 09/25/2012 0520   RDW 13.9 09/25/2012 0520    BMET    Component Value Date/Time   NA 137 09/25/2012 0520   K 4.1 09/25/2012 0520   CL 104 09/25/2012 0520   CO2 26 09/25/2012 0520   GLUCOSE 168* 09/25/2012 0520   BUN 20 09/25/2012 0520   CREATININE 1.01 09/25/2012 0520   CALCIUM 8.9 09/25/2012 0520   GFRNONAA 70* 09/25/2012 0520   GFRAA 81* 09/25/2012 0520    INR    Component Value Date/Time   INR 1.00 09/17/2012 1054     Intake/Output Summary (Last 24 hours) at 09/25/12 0723 Last data filed at 09/25/12 0500  Gross per 24 hour  Intake 3912.5 ml  Output   1560 ml  Net 2352.5 ml     Assessment/Plan:  79 y.o. male is s/p:  Left femoral to above knee popliteal bypass with non-reversed ipsilateral great saphenous vein, intraop arteriogram and left foot first ray amputation.   1 Day Post-Op  -acute surgical blood loss anemia-tolerating -mobilize today. -will get social work to evaluate pt.  Per RN, pt lives in assisted living and may need placement in SNF for bridge back to assisted living. -DVT prophylaxis: lovenox daily starting today -bandage to left foot per Dr. Erasmo Score, PA-C [facility] [phone] 09/25/2012 7:23 AM   Pain  controlled Foot warm, posterior tibial doppler signal, no hematoma Back to SNF tomorrow FL2 to chart  Fabienne Bruns, MD [facility] [place] Office: [phone] Pager: [phone]

## 2012-09-25 NOTE — Evaluation (Signed)
Physical Therapy Evaluation Patient Details Name: Henry Mcdaniel. MRN: [id number] DOB: Jul 25, 1933 Today's Date: 09/25/2012 Time: 5284-1324 PT Time Calculation (min): 30 min  PT Assessment / Plan / Recommendation Clinical Impression  Patient is a 79 y/o gentleman s/p left first ray amputation and revascularization procedure who presents with limited independence with mobility due to decreased balance, decreased knowledge of precautions, decreased safety awareness, decreased activity tolerance with HR up to 124 with ambulation.  He will benefit from skilled PT in the acute setting and likely in SNF setting at discharge to address issues and maximize independence and safety prior to return to ALF.    PT Assessment  Patient needs continued PT services    Follow Up Recommendations  SNF          Equipment Recommendations  None recommended by PT       Frequency Min 3X/week    Precautions / Restrictions Precautions Precautions: Fall Restrictions Weight Bearing Restrictions: Yes LLE Weight Bearing: Touchdown weight bearing   Pertinent Vitals/Pain Denies pain; HR max with ambulation 124      Mobility  Bed Mobility Bed Mobility: Supine to Sit;Sitting - Scoot to Edge of Bed;Sit to Supine Supine to Sit: 3: Mod assist;HOB elevated Sitting - Scoot to Edge of Bed: 3: Mod assist Sit to Supine: 4: Min assist Details for Bed Mobility Assistance: provided pillow and blankets for Lt LE elevation. Pt with poor awareness of LT LE management for edema Transfers Sit to Stand: 4: Min assist;From elevated surface;From bed;1: +2 Total assist;From chair/3-in-1 (total +2 from chair height) Sit to Stand: Patient Percentage: 30% Stand to Sit: 3: Mod assist;With upper extremity assist;To bed Details for Transfer Assistance: cues for hand placement  Ambulation/Gait Ambulation/Gait Assistance: 4: Min assist;3: Mod assist Ambulation Distance (Feet): 15 Feet (x 2) Assistive device: Rolling  walker Ambulation/Gait Assistance Details: ambulated with heel wedge Darco, but unable to maintain TDWB due to cognitive deficits, pt intent on going to bathroom so walked there with walker and mod to min assist for balance, walker safety and maneuvering walker Gait Pattern: Step-to pattern;Trunk flexed;Decreased step length - right;Decreased stance time - left        PT Diagnosis: Difficulty walking;Abnormality of gait  PT Problem List: Decreased activity tolerance;Decreased balance;Decreased mobility;Decreased knowledge of use of DME;Decreased safety awareness;Decreased knowledge of precautions PT Treatment Interventions: DME instruction;Gait training;Functional mobility training;Therapeutic activities;Therapeutic exercise;Patient/family education;Balance training   PT Goals Acute Rehab PT Goals PT Goal Formulation: Patient unable to participate in goal setting Time For Goal Achievement: 10/09/12 Potential to Achieve Goals: Fair Pt will go Supine/Side to Sit: with supervision;with rail PT Goal: Supine/Side to Sit - Progress: Goal set today Pt will Sit at Edge of Bed: with supervision;with no upper extremity support;3-5 min PT Goal: Sit at Edge Of Bed - Progress: Goal set today Pt will go Sit to Supine/Side: with supervision;with rail PT Goal: Sit to Supine/Side - Progress: Goal set today Pt will go Sit to Stand: with supervision;with upper extremity assist PT Goal: Sit to Stand - Progress: Goal set today Pt will go Stand to Sit: with supervision;with upper extremity assist PT Goal: Stand to Sit - Progress: Goal set today Pt will Transfer Bed to Chair/Chair to Bed: with supervision PT Transfer Goal: Bed to Chair/Chair to Bed - Progress: Goal set today Pt will Ambulate: 1 - 15 feet;with min assist;with rolling walker;Other (comment) (with cues for weight bearing ) PT Goal: Ambulate - Progress: Goal set today Pt will Propel Wheelchair: >  150 feet;with modified independence PT Goal:  Propel Wheelchair - Progress: Goal set today  Visit Information  Last PT Received On: 09/25/12 Assistance Needed: +2 (for safety)    Subjective Data  Subjective: You're from the paper! Patient Stated Goal: None stated   Prior Functioning  Home Living Lives With: Other (Comment) (reports living at [facility] [place] house - mother) Bathroom Shower/Tub: Other (comment) (sponge bath) Home Adaptive Equipment: Wheelchair - manual Additional Comments: "get around by w/c"-per patient he does not ambulate. pt reports goes to dining hall 50% of the time. Henry Mcdaniel (nephew) gets meals / snacks is what patient is reporting (from ALF per chart- completes ADLS and ambulates) Prior Function Comments: pt poor historian with answering questions about home situation and prior functioning,  states he lives with mother, and that tomorrow on birthday will be 69. Communication Communication: No difficulties Dominant Hand: Right    Cognition  Cognition Arousal/Alertness: Awake/alert Behavior During Therapy: Restless Overall Cognitive Status: Difficult to assess Area of Impairment: Orientation;Attention;Memory;Problem solving;Awareness Orientation Level: Disoriented to;Place;Time;Situation;Person ("i think I am going to be 20 tomorrow") Current Attention Level: Sustained Memory: Decreased recall of precautions Awareness: Anticipatory;Emergent;Intellectual Problem Solving: Slow processing;Decreased initiation;Difficulty sequencing    Extremity/Trunk Assessment Right Upper Extremity Assessment RUE ROM/Strength/Tone: Within functional levels RUE Sensation: WFL - Light Touch RUE Coordination: WFL - gross/fine motor Left Upper Extremity Assessment LUE ROM/Strength/Tone: Within functional levels LUE Sensation: WFL - Light Touch LUE Coordination: WFL - gross/fine motor Right Lower Extremity Assessment RLE ROM/Strength/Tone: WFL for tasks assessed Left Lower Extremity Assessment LLE ROM/Strength/Tone:  Deficits;Due to pain LLE ROM/Strength/Tone Deficits: AROM ankle WFL, strength positive anitgravity further not tested due to pt easily agitated; noted dressing over incision on medial aspect of leg above and below knee and traversing knee Trunk Assessment Trunk Assessment: Normal   Balance Balance Balance Assessed: Yes Static Sitting Balance Static Sitting - Balance Support: Bilateral upper extremity supported;Feet supported Static Sitting - Level of Assistance: 3: Mod assist;4: Min assist (required extended time at EOB for incr balance) Static Sitting - Comment/# of Minutes: initially needed increased assist due to falling to right, able to improve to less assist over time  End of Session PT - End of Session Equipment Utilized During Treatment: Gait belt Activity Tolerance: Patient tolerated treatment well Patient left: in bed;with call bell/phone within reach;with bed alarm set;with nursing in room (sitter)  GP     [facility] 09/25/2012, 10:32 AM Sheran Lawless, PT [phone] 09/25/2012

## 2012-09-25 NOTE — Evaluation (Signed)
Occupational Therapy Evaluation Patient Details Name: Henry Mcdaniel. MRN: [id number] DOB: February 24, 1934 Today's Date: 09/25/2012 Time: 7829-5621 OT Time Calculation (min): 30 min  OT Assessment / Plan / Recommendation Clinical Impression  79 yo male s/p BYPASS GRAFT FEMORAL-POPLITEAL ARTERY (Left) LEFT FOOT FIRST RAY AMPUTATION (Left). Ot to follow acutely. Recommend SNF for d/c      OT Assessment  Patient needs continued OT Services    Follow Up Recommendations  SNF    Barriers to Discharge      Equipment Recommendations  Wheelchair (measurements OT);Wheelchair cushion (measurements OT) (w/c to maintain weight bearing)    Recommendations for Other Services    Frequency  Min 2X/week    Precautions / Restrictions Precautions Precautions: Fall Restrictions Weight Bearing Restrictions: Yes LLE Weight Bearing: Touchdown weight bearing   Pertinent Vitals/Pain Reports no pain Provided cueing to Lt LE and no  Pain reported    ADL  Grooming: Wash/dry hands;Moderate assistance (hand placed under water refusing soap) Where Assessed - Grooming: Supported sitting ("I just used clear water") Lower Body Dressing: +1 Total assistance Where Assessed - Lower Body Dressing: Supine, head of bed up Toilet Transfer: Moderate assistance Toilet Transfer Method: Sit to stand Toilet Transfer Equipment: Regular height toilet;Grab bars Toileting - Clothing Manipulation and Hygiene: Moderate assistance Where Assessed - Toileting Clothing Manipulation and Hygiene: Sit to stand from 3-in-1 or toilet Equipment Used: Gait belt;Rolling walker Transfers/Ambulation Related to ADLs: Pt ambulating on heel in cam boot. Pt does not maintain TWB on Rt foot. Poor awareness to surgery ADL Comments: Pt agreeable to OOB. Pt attempting to complete sit<>Stand impulsive and needs cues to remain seated. Pt unaware of any changes or surgery. Pt becoming increasingly agitated with ambulation and mobility. Pt attempting  to look outside of room. Pt reports that thearpist are from newspaper and needs reeducation he is at the hospital and we are therapist. Pt attempting to stand due to need to void bladder. pt voiding bladder with noted blood clots in void. Pt required water running to initiation of void bladder. Pt returned to bed in supine position. denies any pain    OT Diagnosis: Generalized weakness;Cognitive deficits  OT Problem List: Decreased strength;Decreased activity tolerance;Impaired balance (sitting and/or standing);Decreased cognition;Decreased safety awareness;Decreased knowledge of use of DME or AE OT Treatment Interventions: Self-care/ADL training;DME and/or AE instruction;Therapeutic activities;Patient/family education;Balance training;Cognitive remediation/compensation   OT Goals Acute Rehab OT Goals OT Goal Formulation: Patient unable to participate in goal setting Time For Goal Achievement: 10/09/12 Potential to Achieve Goals: Good ADL Goals Pt Will Perform Grooming: with min assist;Standing at sink ADL Goal: Grooming - Progress: Goal set today Pt Will Transfer to Toilet: with min assist;Ambulation;3-in-1 ADL Goal: Toilet Transfer - Progress: Goal set today Pt Will Perform Toileting - Clothing Manipulation: with min assist;Sitting on 3-in-1 or toilet ADL Goal: Toileting - Clothing Manipulation - Progress: Goal set today Pt Will Perform Toileting - Hygiene: with min assist;Sit to stand from 3-in-1/toilet ADL Goal: Toileting - Hygiene - Progress: Goal set today Miscellaneous OT Goals Miscellaneous OT Goal #1: PT will complete bed mobility min (A)  OT Goal: Miscellaneous Goal #1 - Progress: Goal set today  Visit Information  Last OT Received On: 09/25/12 Assistance Needed: +2 (for safety) PT/OT Co-Evaluation/Treatment: Yes    Subjective Data  Subjective: "Ya'll from the newspaper?" Patient Stated Goal: none stated - requesting to go to the bathroom   Prior Functioning     Home  Living Lives With: Other (Comment) (reports living  at [facility] - mother) Bathroom Shower/Tub: Other (comment) (sponge bath) Home Adaptive Equipment: Wheelchair - manual Additional Comments: "get around by w/c"-per patient he does not ambulate. pt reports goes to dining hall 50% of the time. Greggory Stallion (nephew) gets meals / snacks is what patient is reporting (from ALF per chart- completes ADLS and ambulates) Prior Function Comments: pt poor historian with answering questions about home situation and prior functioning,  states he lives with mother, and that tomorrow on birthday will be 8. Communication Communication: No difficulties Dominant Hand: Right         Vision/Perception Vision - History Baseline Vision: No visual deficits Patient Visual Report: No change from baseline;Other (comment) (cognitive deficits - difficult to fully assess) Vision - Assessment Vision Assessment: Vision not tested   Cognition  Cognition Arousal/Alertness: Awake/alert Behavior During Therapy: Restless Overall Cognitive Status: Difficult to assess Area of Impairment: Orientation;Attention;Memory;Problem solving;Awareness Orientation Level: Disoriented to;Place;Time;Situation;Person ("i think I am going to be 20 tomorrow") Current Attention Level: Sustained Memory: Decreased recall of precautions Awareness: Anticipatory;Emergent;Intellectual Problem Solving: Slow processing;Decreased initiation;Difficulty sequencing    Extremity/Trunk Assessment Right Upper Extremity Assessment RUE ROM/Strength/Tone: Within functional levels RUE Sensation: WFL - Light Touch RUE Coordination: WFL - gross/fine motor Left Upper Extremity Assessment LUE ROM/Strength/Tone: Within functional levels LUE Sensation: WFL - Light Touch LUE Coordination: WFL - gross/fine motor Right Lower Extremity Assessment RLE ROM/Strength/Tone: WFL for tasks assessed Left Lower Extremity Assessment LLE ROM/Strength/Tone:  Deficits;Due to pain LLE ROM/Strength/Tone Deficits: AROM ankle WFL, strength positive anitgravity further not tested due to pt easily agitated; noted dressing over incision on medial aspect of leg above and below knee and traversing knee Trunk Assessment Trunk Assessment: Normal     Mobility Bed Mobility Bed Mobility: Supine to Sit;Sitting - Scoot to Edge of Bed;Sit to Supine Supine to Sit: 3: Mod assist;HOB elevated Sitting - Scoot to Edge of Bed: 3: Mod assist Sit to Supine: 4: Min assist Details for Bed Mobility Assistance: provided pillow and blankets for Lt LE elevation. Pt with poor awareness of LT LE management for edema Transfers Transfers: Sit to Stand;Stand to Sit Sit to Stand: 4: Min assist;From elevated surface;From bed;1: +2 Total assist;From chair/3-in-1 (total +2 from chair height) Sit to Stand: Patient Percentage: 30% Stand to Sit: 3: Mod assist;With upper extremity assist;To bed Details for Transfer Assistance: cues for hand placement      Exercise     Balance Balance Balance Assessed: Yes Static Sitting Balance Static Sitting - Balance Support: Bilateral upper extremity supported;Feet supported Static Sitting - Level of Assistance: 3: Mod assist;4: Min assist (required extended time at EOB for incr balance) Static Sitting - Comment/# of Minutes: ~Pt lob to the right side initially and with biL LE on floor incr balance to min (A)   End of Session OT - End of Session Activity Tolerance: Patient tolerated treatment well Patient left: in bed;with call bell/phone within reach;Other (comment) Psychiatrist) Nurse Communication: Mobility status;Precautions  GO     Lucile Shutters 09/25/2012, 10:23 AM Pager: [phone]

## 2012-09-25 NOTE — Progress Notes (Signed)
Patient ID: Henry Mcdaniel., male   DOB: Dec 19, 1933, 79 y.o.   MRN: [id number] Postoperative day 1 left foot first ray amputation. Physical therapy progressive ambulation touchdown weightbearing on the left. All followup in the office in 2 weeks.

## 2012-09-25 NOTE — Progress Notes (Signed)
Report called to Selena Batten, receiving RN on 2000.  Pt transferred to 2007 via bed with all belongings and with sitter. Called pt's family nephew, Kinnie Feil, with new room number.    Roselie Awkward, RN

## 2012-09-26 LAB — GLUCOSE, CAPILLARY: Glucose-Capillary: 171 mg/dL — ABNORMAL HIGH (ref 70–99)

## 2012-09-26 NOTE — Progress Notes (Signed)
Occupational Therapy Treatment Patient Details Name: Henry Mcdaniel. MRN: [id number] DOB: [birth year] Today's Date: 09/26/2012 Time: 1451-1510 OT Time Calculation (min): 19 min  OT Assessment / Plan / Recommendation Comments on Treatment Session Pt. with improving independence with functional mobility and ADLs.  Pt requires max verbal cues for WBing precautions.  Pt continues to be confused.  Continue to recommend SNF    Follow Up Recommendations  SNF    Barriers to Discharge       Equipment Recommendations  None recommended by OT    Recommendations for Other Services    Frequency Min 2X/week   Plan Discharge plan needs to be updated    Precautions / Restrictions Precautions Precautions: Fall Restrictions Weight Bearing Restrictions: Yes LLE Weight Bearing: Touchdown weight bearing   Pertinent Vitals/Pain     ADL  Upper Body Dressing: Moderate assistance Where Assessed - Upper Body Dressing: Unsupported sitting;Supported standing Lower Body Dressing: Maximal assistance Where Assessed - Lower Body Dressing: Supported sit to stand Toilet Transfer: +2 Total assistance Toilet Transfer: Patient Percentage: 80% Toilet Transfer Method: Sit to Barista: Comfort height toilet;Grab bars Toileting - Clothing Manipulation and Hygiene: Moderate assistance Where Assessed - Toileting Clothing Manipulation and Hygiene: Standing Equipment Used: Gait belt;Rolling walker Transfers/Ambulation Related to ADLs: Pt ambulates with Darco boot and RW with Total A +2 (pt ~80%).  Pt. with occasional LOB and max cues for WBing ADL Comments: Pt self distracts requiring cues to stay on task    OT Diagnosis:    OT Problem List:   OT Treatment Interventions:     OT Goals Acute Rehab OT Goals OT Goal Formulation: With patient Time For Goal Achievement: 10/09/12 Potential to Achieve Goals: Good ADL Goals Pt Will Perform Grooming: with min assist;Standing at sink Pt Will  Perform Lower Body Bathing: with min assist;Sit to stand from bed;Sit to stand from chair ADL Goal: Lower Body Bathing - Progress: Goal set today Pt Will Perform Upper Body Dressing: with set-up;with supervision;Sitting, chair;Sitting, bed ADL Goal: Upper Body Dressing - Progress: Goal set today Pt Will Perform Lower Body Dressing: with min assist;Sit to stand from chair;Sit to stand from bed ADL Goal: Lower Body Dressing - Progress: Goal set today Pt Will Transfer to Toilet: with min assist;Ambulation;3-in-1 ADL Goal: Toilet Transfer - Progress: Progressing toward goals Pt Will Perform Toileting - Clothing Manipulation: with min assist;Sitting on 3-in-1 or toilet ADL Goal: Toileting - Clothing Manipulation - Progress: Progressing toward goals Pt Will Perform Toileting - Hygiene: with min assist;Sit to stand from 3-in-1/toilet Miscellaneous OT Goals Miscellaneous OT Goal #1: PT will complete bed mobility min (A)   Visit Information  Last OT Received On: 09/26/12 Assistance Needed: +2 PT/OT Co-Evaluation/Treatment: Yes    Subjective Data      Prior Functioning       Cognition  Cognition Arousal/Alertness: Awake/alert Behavior During Therapy: WFL for tasks assessed/performed Overall Cognitive Status: Impaired/Different from baseline Area of Impairment: Attention;Memory;Safety/judgement;Awareness Current Attention Level: Sustained Memory: Decreased recall of precautions Safety/Judgement: Decreased awareness of safety;Decreased awareness of deficits Awareness: Intellectual Problem Solving: Slow processing;Decreased initiation;Difficulty sequencing    Mobility  Bed Mobility Bed Mobility: Not assessed Transfers Transfers: Sit to Stand;Stand to Sit Sit to Stand: 4: Min assist;With upper extremity assist;From chair/3-in-1 Stand to Sit: 4: Min assist;With upper extremity assist;To chair/3-in-1 Details for Transfer Assistance: cues for hand placement     Exercises      Balance      End of Session OT -  End of Session Equipment Utilized During Treatment: Gait belt (Darco boot) Activity Tolerance: Patient tolerated treatment well Patient left: in chair;with call bell/phone within reach;with family/visitor present Nurse Communication: Mobility status  GO     Demetrick Eichenberger M 09/26/2012, 3:23 PM

## 2012-09-26 NOTE — Progress Notes (Signed)
Clinical Child psychotherapist (CSW) left a message for pt niece as it appears that pt will now need SNF placement. CSW to receive consent to do a SNF search.  Full assessment to follow.  Theresia Bough, MSW, Theresia Majors [phone]

## 2012-09-26 NOTE — Progress Notes (Addendum)
Clinical Social Work Department CLINICAL SOCIAL WORK PLACEMENT NOTE 09/26/2012  Patient:  Henry Mcdaniel, Henry Mcdaniel  Account Number:  [id number] Admit date:  09/24/2012  Clinical Social Worker:  Theresia Bough, Theresia Majors  Date/time:  09/26/2012 02:41 PM  Clinical Social Work is seeking post-discharge placement for this patient at the following level of care:   SKILLED NURSING   (*CSW will update this form in Epic as items are completed)   09/26/2012  Patient/family provided with [facility] Department of Clinical Social Work's list of facilities offering this level of care within the geographic area requested by the patient (or if unable, by the patient's family).  09/26/2012  Patient/family informed of their freedom to choose among providers that offer the needed level of care, that participate in Medicare, Medicaid or managed care program needed by the patient, have an available bed and are willing to accept the patient.  09/26/2012  Patient/family informed of [facility]' ownership interest in [facility], as well as of the fact that they are under no obligation to receive care at this facility.  PASARR submitted to EDS on  PASARR number received from EDS on existing # [id number]  FL2 transmitted to all facilities in geographic area requested by pt/family on   FL2 transmitted to all facilities within larger geographic area on   Patient informed that his/her managed care company has contracts with or will negotiate with  certain facilities, including the following:     Patient/family informed of bed offers received:  09/27/12 Patient chooses bed at [facility] Physician recommends and patient chooses bed at    Patient to be transferred to [facility] on  09/27/12 Patient to be transferred to facility by [facility]  The following physician request were entered in Epic:   Additional Comments: Family requesting placement in [facility].  Theresia Bough, MSW, Theresia Majors [phone]

## 2012-09-26 NOTE — Progress Notes (Addendum)
[facility] Progress Note  09/26/2012 7:19 AM 2 Days Post-Op  Subjective:  No complaints this am.  States he is feeling good.  Tm 100.4 last pm now afebrile this am VSS 98% RA  Filed Vitals:   09/26/12 0459  BP: 146/61  Pulse:   Temp: 98.4 F (36.9 C)  Resp: 18    Physical Exam: Incisions:  All incisions are c/d/i with staples in tact.  Left groin wound is c/d/i with some ecchymosis, but soft. Extremities:  + doppler signal in left PT.  LLE is warm.  Left foot bandaged and left in tact per Dr. [name].  CBC    Component Value Date/Time   WBC 7.0 09/25/2012 0520   RBC 3.53* 09/25/2012 0520   HGB 10.9* 09/25/2012 0520   HCT 32.2* 09/25/2012 0520   PLT 128* 09/25/2012 0520   MCV 91.2 09/25/2012 0520   MCH 30.9 09/25/2012 0520   MCHC 33.9 09/25/2012 0520   RDW 13.9 09/25/2012 0520    BMET    Component Value Date/Time   NA 137 09/25/2012 0520   K 4.1 09/25/2012 0520   CL 104 09/25/2012 0520   CO2 26 09/25/2012 0520   GLUCOSE 168* 09/25/2012 0520   BUN 20 09/25/2012 0520   CREATININE 1.01 09/25/2012 0520   CALCIUM 8.9 09/25/2012 0520   GFRNONAA 70* 09/25/2012 0520   GFRAA 81* 09/25/2012 0520    INR    Component Value Date/Time   INR 1.00 09/17/2012 1054     Intake/Output Summary (Last 24 hours) at 09/26/12 0719 Last data filed at 09/25/12 1200  Gross per 24 hour  Intake    590 ml  Output     50 ml  Net    540 ml     Assessment/Plan:  79 y.o. male is s/p:  Left femoral to above knee popliteal bypass with non-reversed ipsilateral great saphenous vein, intraop arteriogram and left foot first ray amputation.   2 Days Post-Op   -pt doing well this am. -did not require a sitter last pm. -DVT prophylaxis: lovenox -will discharge back to SNF today.  -f/u with Dr. Darrick Penna in 2 weeks.  Will remove staples at that time. -continue to mobilize -will get FL2 form signed   Doreatha Massed, PA-C [facility] [phone] 09/26/2012 7:19  AM    Left groin with some ecchymosis but overall healing Left foot pink and warm with doppler signals D/c SNf  Fabienne Bruns, MD [facility] [place] Office: [phone] Pager: [phone]

## 2012-09-26 NOTE — Progress Notes (Signed)
Clinical Social Work Department BRIEF PSYCHOSOCIAL ASSESSMENT 09/26/2012  Patient:  Henry Mcdaniel, Henry Mcdaniel     Account Number:  [id number]     Admit date:  09/24/2012  Clinical Social Worker:  Lourdes Sledge  Date/Time:  09/26/2012 02:07 PM  Referred by:  Physician  Date Referred:  09/26/2012 Referred for  SNF Placement   Other Referral:   Interview type:  Family Other interview type:   CSW completed assessment with pt nephew Henry Mcdaniel [phone]    PSYCHOSOCIAL DATA Living Status:  FACILITY Admitted from facility:  [facility] Level of care:  Assisted Living Primary support name:  Henry Mcdaniel [phone] Primary support relationship to patient:  FAMILY Degree of support available:   Pt nephew appears actively involved in pt care.    CURRENT CONCERNS Current Concerns  Post-Acute Placement   Other Concerns:    SOCIAL WORK ASSESSMENT / PLAN CSW received a referral that pt was admitted from [facility] however would need SNF placement at discharge.    CSW contacted pt main contact Henry Mcdaniel [phone] and introduced herself and role. CSW informed nephew of PT recommendations for SNF placement. Pt nephew stated he would be agreeable to SNF placement prior to pt returning to [facility]. Nephew stated he works near [facility] and would therefore prefer placement for pt in [place]. CSW informed nephew that CSW would do a SNF search and follow up with bed offers, nephew very appreciative.   Assessment/plan status:  Psychosocial Support/Ongoing Assessment of Needs Other assessment/ plan:   Information/referral to community resources:   CSW to provide a SNF list.    PATIENT'S/FAMILY'S RESPONSE TO PLAN OF CARE: Pt only oriented to person therefore assessment completed with pt main contact nephew, Henry Mcdaniel [phone] who is agreeable to SNF placement for pt.    CSW to remain following.       Theresia Bough, MSW, Theresia Majors [phone]

## 2012-09-26 NOTE — Discharge Summary (Addendum)
[facility] Discharge Summary  Henry Mcdaniel [birth year] 79 y.o. male  [id number]  Admission Date: 09/24/2012  Discharge Date: 09/28/12  Physician: Sherren Kerns, MD  Admission Diagnosis: PVD W/ GANGRENE TOE   HPI:   This is a 79 y.o. 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 The patient is referred by Dr. [name]. The patient has moderate dementia. Most of the history was provided by his niece. The patient apparently has had drainage from his left first toe for approximately 2 years. He denies any rest pain in the left foot. He states that the area of the toe is sore to palpation. He is a former smoker but quit in April of last year. Dr. [name] has recommended amputation of his left first toe for osteomyelitis. However the patient did not have a normal vascular exam and was referred to [place] for possible revascularization. He returns today for further evaluation after a CT angiogram with lower extremity runoff. He also has a history of coronary artery disease with a myocardial infarction in July 2013. This was in [facility]. He has also been depressed over the last year and has had 40 pound weight loss. Other medical problems include diabetes, hypertension, coronary disease, hyperlipidemia, "black lung disease". These are currently stable. The patient's niece says that his dementia has been stable over the past year. He is able to perform his daily activities. He is ambulatory. He is on doxycycline.  Hospital Course:  The patient was admitted to the hospital and taken to the operating room on 09/24/2012 and underwent: Left femoral to above knee popliteal bypass with non-reversed ipsilateral great saphenous vein, intraop arteriogram by Dr. Darrick Penna and left foot first ray amputation by Dr. [name].    The pt tolerated the procedure well and was transported to the PACU in good condition. By POD 1, he was doing well.  He did have acute  surgical blood loss anemia, which he was tolerating well.  He was transferred to the telemetry floor that day.  By POD 2, he is doing well and incisions are c/d/i.  He is transferred to SNF today.  The remainder of the hospital course consisted of increasing mobilization and increasing intake of solids without difficulty.  CBC    Component Value Date/Time   WBC 7.0 09/25/2012 0520   RBC 3.53* 09/25/2012 0520   HGB 10.9* 09/25/2012 0520   HCT 32.2* 09/25/2012 0520   PLT 128* 09/25/2012 0520   MCV 91.2 09/25/2012 0520   MCH 30.9 09/25/2012 0520   MCHC 33.9 09/25/2012 0520   RDW 13.9 09/25/2012 0520    BMET    Component Value Date/Time   NA 137 09/25/2012 0520   K 4.1 09/25/2012 0520   CL 104 09/25/2012 0520   CO2 26 09/25/2012 0520   GLUCOSE 168* 09/25/2012 0520   BUN 20 09/25/2012 0520   CREATININE 1.01 09/25/2012 0520   CALCIUM 8.9 09/25/2012 0520   GFRNONAA 70* 09/25/2012 0520   GFRAA 81* 09/25/2012 0520     Discharge Instructions:   The patient is discharged to home with extensive instructions on wound care and progressive ambulation.  They are instructed not to drive or perform any heavy lifting until returning to see the physician in his office.  Discharge Orders   Future Appointments Provider Department Dept Phone   10/11/2012 8:30 AM Sherren Kerns, MD [facility] [phone]   Future Orders Complete By  Expires     Call MD for:  redness, tenderness, or signs of infection (pain, swelling, bleeding, redness, odor or green/yellow discharge around incision site)  As directed     Call MD for:  severe or increased pain, loss or decreased feeling  in affected limb(s)  As directed     Call MD for:  temperature >100.5  As directed     Discharge wound care:  As directed     Comments:      Wash the groin wound with soap and water and pat dry.  Then put a dry gauze or washcloth there to keep this area dry.  Do not use Vaseline or neosporin on your incisions.  Only  use soap and water on your incisions and then protect and keep dry.    Driving Restrictions  As directed     Comments:      No driving for 2 weeks    Elevate operative extremity  As directed     Lifting restrictions  As directed     Comments:      No lifting for 6 weeks    Post-op shoe  As directed     Comments:      Left foot    Resume previous diet  As directed     Touch down weight bearing  As directed     Scheduling Instructions:      Touchdown weightbearing on the left.       Discharge Diagnosis:  PVD W/ GANGRENE TOE  Secondary Diagnosis: Patient Active Problem List  Diagnosis  . Atherosclerosis of native arteries of the extremities with gangrene  . Incidental lung nodule, > 3mm and < 8mm  . Coronary atherosclerosis of native coronary artery  . HTN (hypertension)  . Other and unspecified hyperlipidemia   Past Medical History  Diagnosis Date  . Peripheral vascular disease   . Diabetes mellitus without complication   . Hypertension   . Myocardial infarction   . Hyperlipidemia   . Black lung disease   . Carotid artery occlusion   . CAD (coronary artery disease)     MI 2013  . Abdominal aneurysm     originally diagnosed in 2010  . Abdominal aneurysm 2010    originally diagnosed in 2010  . Dementia        Medication List    TAKE these medications       acetaminophen 500 MG tablet  Commonly known as:  TYLENOL  Take 1,000 mg by mouth 3 (three) times daily.     aspirin 325 MG EC tablet  Take 325 mg by mouth daily.     atorvastatin 80 MG tablet  Commonly known as:  LIPITOR  Take 40 mg by mouth at bedtime.     carvedilol 12.5 MG tablet  Commonly known as:  COREG  Take 6.25 mg by mouth 2 (two) times daily with a meal.     clopidogrel 75 MG tablet  Commonly known as:  PLAVIX  Take 75 mg by mouth daily.     diltiazem 120 MG 24 hr capsule  Commonly known as:  CARDIZEM CD  Take 120 mg by mouth daily. At 1300.     Glucerna Liqd  Take 237 mLs by mouth  3 (three) times daily.     haloperidol 0.5 MG tablet  Commonly known as:  HALDOL  Take 0.5 mg by mouth every morning.     haloperidol 1 MG tablet  Commonly known as:  HALDOL  Take 1 mg by mouth at bedtime.     lisinopril 20 MG tablet  Commonly known as:  PRINIVIL,ZESTRIL  Take 10 mg by mouth daily.     metFORMIN 1000 MG tablet  Commonly known as:  GLUCOPHAGE  Take 1,000 mg by mouth 2 (two) times daily with a meal.     mirtazapine 15 MG tablet  Commonly known as:  REMERON  Take 15 mg by mouth at bedtime.     oxyCODONE 5 MG immediate release tablet  Commonly known as:  ROXICODONE  Take 1 tablet (5 mg total) by mouth every 6 (six) hours as needed for pain.     sertraline 50 MG tablet  Commonly known as:  ZOLOFT  Take 50 mg by mouth daily.        Roxicodone #30 No Refill  Disposition: SNF  Patient's condition: is Good  Follow up: 1. Dr. Darrick Penna in 2 weeks-will need staple removal at that time. 2. Dr. [name] in 2 weeks.   Doreatha Massed, PA-C [facility] [phone] 09/26/2012  7:31 AM  No medication changes since 09/26/2012. Ryan Palermo MAUREEN PA-C

## 2012-09-26 NOTE — Progress Notes (Signed)
Physical Therapy Treatment Patient Details Name: Henry Mcdaniel. MRN: [id number] DOB: 1934/03/06 Today's Date: 09/26/2012 Time: 1451-1510 PT Time Calculation (min): 19 min  PT Assessment / Plan / Recommendation Comments on Treatment Session  Pt demonstrates poor compliance with WBing restrictions and has decreased balance requiring assist for ambulation and activities. Pt very impulsive and demonstrates poor safety awareness. Will continue to work with patient to reinforce Wbing and increase safety with mobility.    Follow Up Recommendations  SNF     Does the patient have the potential to tolerate intense rehabilitation     Barriers to Discharge        Equipment Recommendations  None recommended by PT    Recommendations for Other Services    Frequency Min 3X/week   Plan Discharge plan remains appropriate    Precautions / Restrictions Precautions Precautions: Fall Restrictions Weight Bearing Restrictions: Yes LLE Weight Bearing: Touchdown weight bearing   Pertinent Vitals/Pain 3/10    Mobility  Bed Mobility Bed Mobility: Not assessed Transfers Transfers: Sit to Stand;Stand to Sit Sit to Stand: 4: Min assist;With upper extremity assist;From chair/3-in-1 Stand to Sit: 4: Min assist;With upper extremity assist;To chair/3-in-1 Details for Transfer Assistance: cues for hand placement  Ambulation/Gait Ambulation/Gait Assistance: 3: Mod assist;1: +2 Total assist Ambulation/Gait: Patient Percentage: 80% Ambulation Distance (Feet): 160 Feet Assistive device: Rolling walker Ambulation/Gait Assistance Details: patient with poor compliance of TWB, despite max VCs. Patient demonstrates 80% with 2 person assist during ambulation but requires increased assist during turns.  Gait Pattern: Step-to pattern;Trunk flexed;Decreased step length - right;Decreased stance time - left Gait velocity: decreased      PT Goals Acute Rehab PT Goals PT Goal Formulation: Patient unable to  participate in goal setting Time For Goal Achievement: 10/09/12 Potential to Achieve Goals: Fair Pt will go Supine/Side to Sit: with supervision;with rail Pt will Sit at Edge of Bed: with supervision;with no upper extremity support;3-5 min Pt will go Sit to Supine/Side: with supervision;with rail Pt will go Sit to Stand: with supervision;with upper extremity assist PT Goal: Sit to Stand - Progress: Progressing toward goal Pt will go Stand to Sit: with supervision;with upper extremity assist PT Goal: Stand to Sit - Progress: Progressing toward goal Pt will Transfer Bed to Chair/Chair to Bed: with supervision PT Transfer Goal: Bed to Chair/Chair to Bed - Progress: Progressing toward goal Pt will Ambulate: 1 - 15 feet;with min assist;with rolling walker;Other (comment) (with cues for weight bearing ) PT Goal: Ambulate - Progress: Progressing toward goal Pt will Propel Wheelchair: > 150 feet;with modified independence  Visit Information  Last PT Received On: 09/26/12 Assistance Needed: +2    Subjective Data  Subjective: [birth year] my [birth year] Patient Stated Goal: none stated   Cognition  Cognition Arousal/Alertness: Awake/alert Behavior During Therapy: WFL for tasks assessed/performed Overall Cognitive Status: Impaired/Different from baseline Area of Impairment: Attention;Memory;Safety/judgement;Awareness Current Attention Level: Sustained Memory: Decreased recall of precautions Safety/Judgement: Decreased awareness of safety;Decreased awareness of deficits Awareness: Intellectual Problem Solving: Slow processing;Decreased initiation;Difficulty sequencing       End of Session PT - End of Session Equipment Utilized During Treatment: Gait belt;Other (comment) (darko shoe) Activity Tolerance: Patient limited by fatigue Patient left: in chair;with call bell/phone within reach;with chair alarm set;with family/visitor present Nurse Communication: Mobility status   GP     Fabio Asa 09/26/2012, 4:01 PM Charlotte Crumb, PT DPT  [phone]

## 2012-09-27 LAB — GLUCOSE, CAPILLARY
Glucose-Capillary: 144 mg/dL — ABNORMAL HIGH (ref 70–99)
Glucose-Capillary: 152 mg/dL — ABNORMAL HIGH (ref 70–99)

## 2012-09-27 NOTE — Progress Notes (Signed)
Pt has received placement at [facility] in [place], pt nephew aware and agreeable. CSW prepared and placed pt dc packet in pt shadow chart. CSW has contacted [facility] for a transport at 12:30pm. No additional CSW needs, CSW signing off.  Theresia Bough, MSW, Theresia Majors [phone]

## 2012-09-27 NOTE — Progress Notes (Signed)
Pt informed of d/c to rehab facility today; IV and tele monitor d/c; will cont. To monitor.

## 2012-09-27 NOTE — Care Management Note (Signed)
    Page 1 of 1   09/27/2012     3:44:32 PM   CARE MANAGEMENT NOTE 09/27/2012  Patient:  COURY, GRIEGER   Account Number:  [id number]  Date Initiated:  09/25/2012  Documentation initiated by:  Donn Pierini  Subjective/Objective Assessment:   Pt admitted s/p PVD- fem-pop bypass     Action/Plan:   PTA pt was at ALF- [facility]- PT eval ordered- CSW to follow for d/c needs and return to facility   Anticipated DC Date:  09/27/2012   Anticipated DC Plan:  SKILLED NURSING FACILITY  In-house referral  Clinical Social Worker      DC Planning Services  CM consult      Choice offered to / List presented to:             Status of service:  Completed, signed off Medicare Important Message given?   (If response is "NO", the following Medicare IM given date fields will be blank) Date Medicare IM given:   Date Additional Medicare IM given:    Discharge Disposition:  SKILLED NURSING FACILITY  Per UR Regulation:  Reviewed for med. necessity/level of care/duration of stay  If discussed at Long Length of Stay Meetings, dates discussed:    Comments:  09/27/12 Miyeko Mahlum,RN,BSN [phone] PT DC'D TO SNF TODAY, PER CSW ARRANGEMENTS.

## 2012-09-27 NOTE — Progress Notes (Addendum)
[facility] [place]  Subjective  - No new complaints.    Objective 122/60 78 98 F (36.7 C) (Oral) 18 97%  Intake/Output Summary (Last 24 hours) at 09/27/12 0908 Last data filed at 09/27/12 0744  Gross per 24 hour  Intake    480 ml  Output    300 ml  Net    180 ml    Left leg incisions clean, dry, and intact. No erythema or drainage noted. Positive doppler monophasic PT left LE Left foot dressing clean and intact-per Dr. [name]  Assessment/Planning: POD #4 Left femoral to above knee popliteal bypass with non-reversed ipsilateral great saphenous vein, intraop arteriogram and left foot first ray amputation.     Clinton Gallant [facility] 09/27/2012 9:08 AM --  Laboratory Lab Results:  Recent Labs  09/24/12 1207 09/25/12 0520  WBC 6.6 7.0  HGB 11.4* 10.9*  HCT 33.1* 32.2*  PLT 125* 128*   BMET  Recent Labs  09/24/12 1207 09/25/12 0520  NA  --  137  K  --  4.1  CL  --  104  CO2  --  26  GLUCOSE  --  168*  BUN  --  20  CREATININE 0.96 1.01  CALCIUM  --  8.9    COAG Lab Results  Component Value Date   INR 1.00 09/17/2012   No results found for this basename: PTT      Left left incisions continue to heal No drainage Some ecchymosis Left foot warm with PT doppler Will d/c home today FL2 signed  Fabienne Bruns, MD [facility] [place] Office: [phone] Pager: [phone]

## 2012-09-27 NOTE — Progress Notes (Signed)
Transport at bedside to transport pt to SNF.

## 2012-09-27 NOTE — Progress Notes (Signed)
Clinical Child psychotherapist (CSW) awaiting to hear back from [facility] on whether they are able to offer placement for pt today. Pt does have a back-up facility [facility]) and is able to dc today. CSW has informed pt main contact Dr. Mikey Bussing who remains agreeable with SNF placement.   Theresia Bough, MSW, Theresia Majors [phone]

## 2012-10-10 ENCOUNTER — Encounter: Payer: Self-pay | Admitting: Vascular Surgery

## 2012-10-11 ENCOUNTER — Other Ambulatory Visit: Payer: Self-pay | Admitting: *Deleted

## 2012-10-11 ENCOUNTER — Ambulatory Visit (INDEPENDENT_AMBULATORY_CARE_PROVIDER_SITE_OTHER): Payer: MEDICARE | Admitting: Vascular Surgery

## 2012-10-11 ENCOUNTER — Encounter: Payer: Self-pay | Admitting: Vascular Surgery

## 2012-10-11 VITALS — BP 167/70 | HR 82 | Temp 97.5°F | Ht 69.0 in | Wt 183.0 lb

## 2012-10-11 DIAGNOSIS — I70269 Atherosclerosis of native arteries of extremities with gangrene, unspecified extremity: Secondary | ICD-10-CM

## 2012-10-11 DIAGNOSIS — Z48812 Encounter for surgical aftercare following surgery on the circulatory system: Secondary | ICD-10-CM

## 2012-10-11 DIAGNOSIS — I739 Peripheral vascular disease, unspecified: Secondary | ICD-10-CM

## 2012-10-11 NOTE — Progress Notes (Signed)
Patient is a 79 year old male who returns for followup today. He underwent left femoral to above-knee popliteal bypass with vein in April 21. He also had amputation of his left first toe by Dr. [name] at that time. He has one-vessel runoff via the peroneal. He denies any rest pain in his foot. He saw [name] last week and sutures from the toe were removed. He denies any drainage from his incisions.  Physical exam:  Filed Vitals:   10/11/12 0848  BP: 167/70  Pulse: 82  Temp: 97.5 F (36.4 C)  TempSrc: Oral  Height: 5\' 9"  (1.753 m)  Weight: 183 lb (83.008 kg)  SpO2: 100%   Left lower extremity: All incisions well-healed at this point, small amount of drainage from prior suture areas left first toe mild erythema, palpable popliteal pulse, foot pink and warm  Assessment: Patent left lower extremity bypass with slowly healing toe amputation site  Plan: Followup ABIs in duplex scan in 3 months. Wash toe amputation site gently with soap and water once daily. All staples were removed from the left leg today.  Fabienne Bruns, MD [facility] [place] Office: [phone] Pager: [phone]

## 2013-03-13 ENCOUNTER — Telehealth: Payer: Self-pay | Admitting: *Deleted

## 2013-03-13 NOTE — Telephone Encounter (Signed)
Called the number listed for the pt He is no longer at that facility and they could not give a new contact number   I called his emergency contact Dr Caleen Jobs who is the pt's nephew and [facility]

## 2013-03-13 NOTE — Telephone Encounter (Signed)
Message copied by Christen Butter on Wed Mar 13, 2013  4:07 PM ------      Message from: Nyoka Cowden      Created: Tue Mar 12, 2013  2:00 PM       Limited ct chest no contrast             ----- Message -----         From: Christen Butter, CMA         Sent: 02/20/2013  11:48 AM           To: Nyoka Cowden, MD            Hey I looked back at last avs and it says just needs cxr in 6 months      Please verify if needs ct or cxr, thanks!      ----- Message -----         From: Nyoka Cowden, MD         Sent: 09/06/2012   1:10 PM           To: Christen Butter, CMA            Needs limited CT of rml nodule s contrast             ------

## 2013-03-27 NOTE — Telephone Encounter (Signed)
ATC nephew again, NA and no option to leave a msg, [facility]

## 2013-03-28 NOTE — Telephone Encounter (Signed)
LMTCB for Dr Pennie Rushing

## 2013-04-02 ENCOUNTER — Encounter: Payer: Self-pay | Admitting: Internal Medicine

## 2013-04-02 NOTE — Telephone Encounter (Signed)
Try faxing a copy of this note to his primary and mailing him?

## 2013-04-02 NOTE — Telephone Encounter (Signed)
Dr. Sherene Sires, I have tried to reach this pt to let him know he is due for ct chest However, the number listed for him is a nursing facility which he no longer resides in, and I have called emergency contact (his uncle who is a doc) and left several msg with no response Please advise what to do, thanks

## 2013-04-04 ENCOUNTER — Encounter: Payer: Self-pay | Admitting: *Deleted

## 2013-04-04 NOTE — Telephone Encounter (Signed)
Letter mailed to the address on file  Copy of this note faxed to PCP on file

## 2013-04-18 ENCOUNTER — Telehealth: Payer: Self-pay | Admitting: Internal Medicine

## 2013-04-18 DIAGNOSIS — R911 Solitary pulmonary nodule: Secondary | ICD-10-CM

## 2013-04-18 NOTE — Telephone Encounter (Signed)
Spoke with the pt's great niece I notified her that the pt needs to have ct chest  She states that we can call her to set this up and she will take the pt to have scan done Order was sent to [facility]

## 2013-04-18 NOTE — Telephone Encounter (Signed)
Pt's family member is returning a call in response to a letter to to the patient.  Antionette Fairy

## 2013-04-26 ENCOUNTER — Other Ambulatory Visit: Payer: MEDICARE

## 2013-05-09 ENCOUNTER — Encounter: Payer: Self-pay | Admitting: Family

## 2013-05-10 ENCOUNTER — Ambulatory Visit (INDEPENDENT_AMBULATORY_CARE_PROVIDER_SITE_OTHER)
Admission: RE | Admit: 2013-05-10 | Discharge: 2013-05-10 | Disposition: A | Discharge status: Home / Self Care | Payer: MEDICARE | Source: Ambulatory Visit | Attending: Internal Medicine | Admitting: Internal Medicine

## 2013-05-10 ENCOUNTER — Ambulatory Visit (INDEPENDENT_AMBULATORY_CARE_PROVIDER_SITE_OTHER)
Admission: RE | Admit: 2013-05-10 | Discharge: 2013-05-10 | Disposition: A | Discharge status: Home / Self Care | Payer: MEDICARE | Source: Ambulatory Visit | Attending: Family | Admitting: Family

## 2013-05-10 ENCOUNTER — Telehealth: Payer: Self-pay | Admitting: Family

## 2013-05-10 ENCOUNTER — Ambulatory Visit (HOSPITAL_COMMUNITY)
Admission: RE | Admit: 2013-05-10 | Discharge: 2013-05-10 | Disposition: A | Discharge status: Home / Self Care | Payer: MEDICARE | Source: Ambulatory Visit | Attending: Family | Admitting: Family

## 2013-05-10 ENCOUNTER — Ambulatory Visit: Payer: MEDICARE | Admitting: Family

## 2013-05-10 ENCOUNTER — Encounter: Payer: Self-pay | Admitting: Family

## 2013-05-10 VITALS — BP 148/71 | HR 69 | Resp 16 | Ht 72.0 in | Wt 196.0 lb

## 2013-05-10 DIAGNOSIS — R911 Solitary pulmonary nodule: Secondary | ICD-10-CM

## 2013-05-10 DIAGNOSIS — I739 Peripheral vascular disease, unspecified: Secondary | ICD-10-CM

## 2013-05-10 DIAGNOSIS — Z48812 Encounter for surgical aftercare following surgery on the circulatory system: Secondary | ICD-10-CM | POA: Insufficient documentation

## 2013-05-10 NOTE — Telephone Encounter (Addendum)
Message copied by Fredrich Birks on Fri May 10, 2013  2:25 PM ------      Message from: Annye Rusk      Created: Fri May 10, 2013  2:11 PM      Regarding: follow up appointment       Please schedule for ABI's and LLE Duplex for 3 months if not already scheduled.      Thanks,      Rosalita Chessman ------  05/10/13: sent pt appt letter to home address, unable to reach at home #, dpm

## 2013-05-10 NOTE — Progress Notes (Signed)
  Patient left before being seen.  Letter sent to patient dated 05/20/13   I have reviewed your Vascular Lab study performed on 05/10/2013. Your vascular status is stable and we recommend no additional evaluation at this time.  I would like for you to have a follow up Vascular Lab study in 3 months along with an appointment with me or our physician extender. Enclosed is next appointment date and time.  Please do not hesitate to call our office if you have any questions or if there are changes in your condition prior to your next scheduled appointment.    Sincerely,    B. Imogene Burn

## 2013-05-11 ENCOUNTER — Encounter: Payer: Self-pay | Admitting: Internal Medicine

## 2013-05-13 NOTE — Progress Notes (Signed)
Quick Note:  Sonda Rumble (Niece/Nephew) [phone] Called and [facility] for Ashleigh ______

## 2013-05-17 NOTE — Progress Notes (Signed)
Quick Note:  LMTCB 12/12 ______

## 2013-05-20 ENCOUNTER — Other Ambulatory Visit: Payer: Self-pay | Admitting: *Deleted

## 2013-05-20 ENCOUNTER — Encounter: Payer: Self-pay | Admitting: Vascular Surgery

## 2013-05-20 DIAGNOSIS — I739 Peripheral vascular disease, unspecified: Secondary | ICD-10-CM

## 2013-05-20 DIAGNOSIS — Z48812 Encounter for surgical aftercare following surgery on the circulatory system: Secondary | ICD-10-CM

## 2013-05-22 ENCOUNTER — Encounter: Payer: Self-pay | Admitting: Internal Medicine

## 2013-05-22 NOTE — Progress Notes (Signed)
Quick Note:    Letter mailed.  ______

## 2013-08-08 ENCOUNTER — Other Ambulatory Visit (HOSPITAL_COMMUNITY): Payer: MEDICARE

## 2013-08-08 ENCOUNTER — Ambulatory Visit: Payer: MEDICARE | Admitting: Family

## 2013-08-08 ENCOUNTER — Encounter (HOSPITAL_COMMUNITY): Payer: MEDICARE

## 2013-09-18 NOTE — Telephone Encounter (Signed)
error 

## 2013-12-03 IMAGING — CT CT ANGIO AOBIFEM WO/W CM
1 of 14 series · 11 of 33 positions shown · IV contrast ([ID] OMNI [id number])
Comparison: None.

***ADDENDUM*** CREATED: 08/23/2012 [DATE]

There is a 5 mm right middle lobe pulmonary nodule. If the patient
is at high risk for bronchogenic carcinoma, follow-up chest CT at 6-
12 months is recommended.  If the patient is at low risk for
bronchogenic carcinoma, follow-up chest CT at 12 months is
recommended.  This recommendation follows the consensus statement:
Guidelines for Management of Small Pulmonary Nodules Detected on CT
Scans: A Statement from the [HOSPITAL] as published in
***END ADDENDUM*** SIGNED BY: Janney Bolong, M.D.
CLINICAL DATA: Left great toe osteomyelitis
CT ANGIOGRAPHY OF ABDOMINAL AORTA WITH ILIOFEMORAL RUNOFF
TECHNIQUE: Multidetector CT imaging of the abdomen, pelvis and
lower extremities was performed using the standard protocol during
bolus administration of intravenous contrast.  Multiplanar CT image
reconstructions including MIPs were obtained to evaluate the
vascular anatomy.
Contrast: 150mL OMNIPAQUE IOHEXOL 350 MG/ML SOLN

[Series 7: runoff · axial · 0.74mm/px · z∈[-1185,-65]mm · 11 of 538 slices shown]
[im 45/538  soft-tissue]
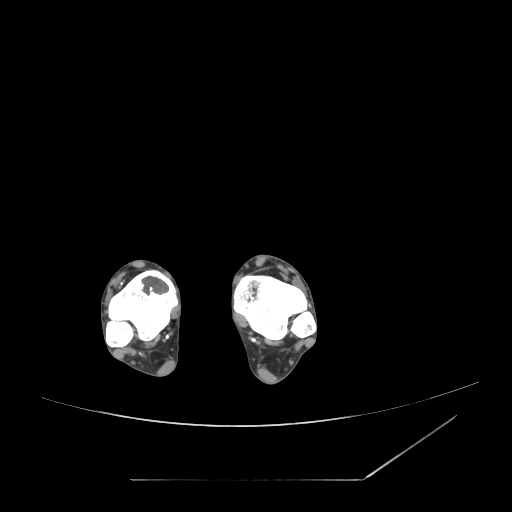
[im 90/538  bone]
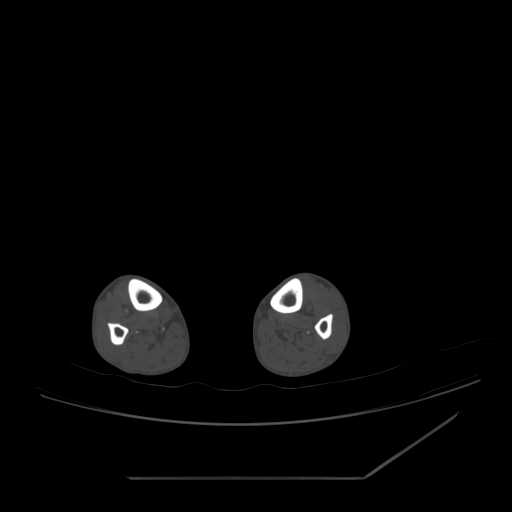
[im 135/538  soft-tissue]
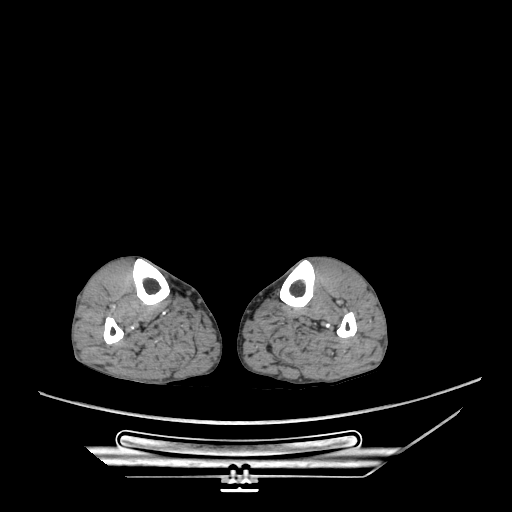
[im 180/538  bone]
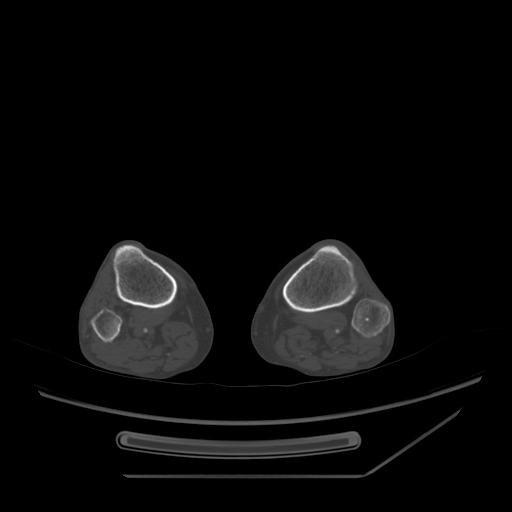
[im 224/538  soft-tissue]
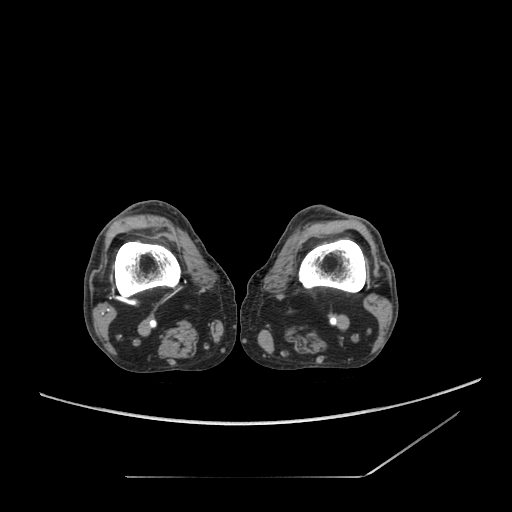
[im 269/538  bone]
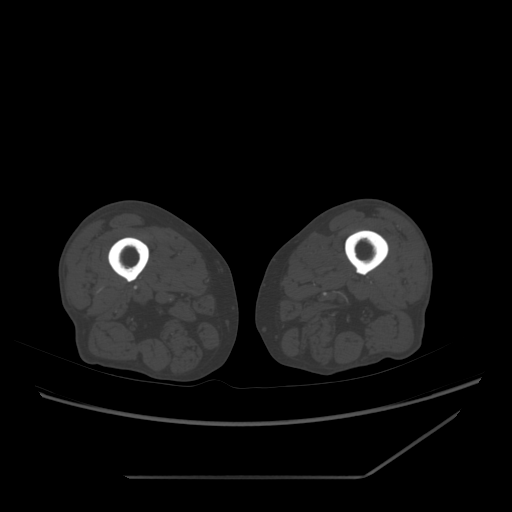
[im 314/538  soft-tissue]
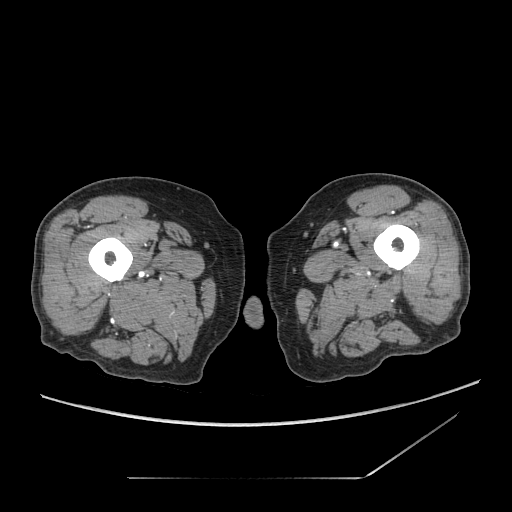
[im 359/538  bone]
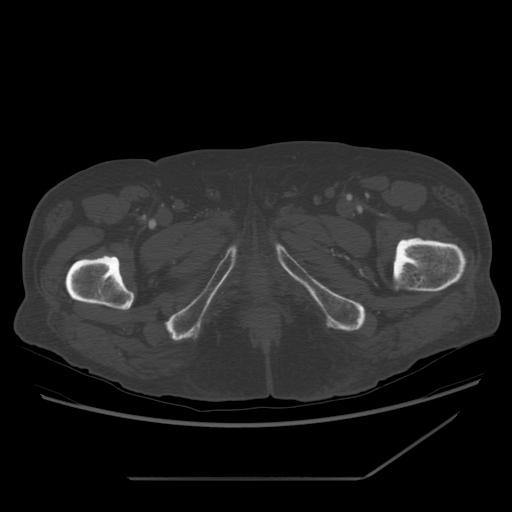
[im 403/538  soft-tissue]
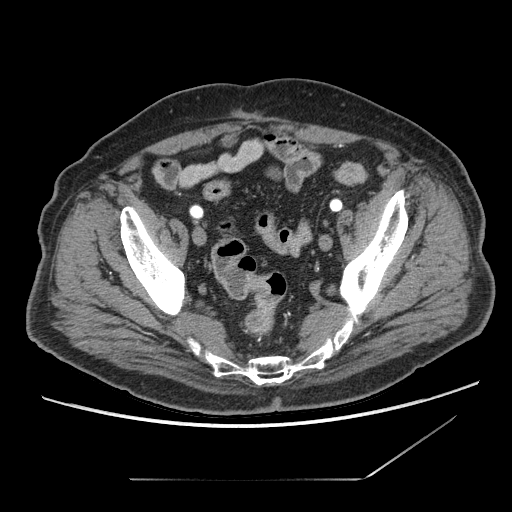
[im 448/538  bone]
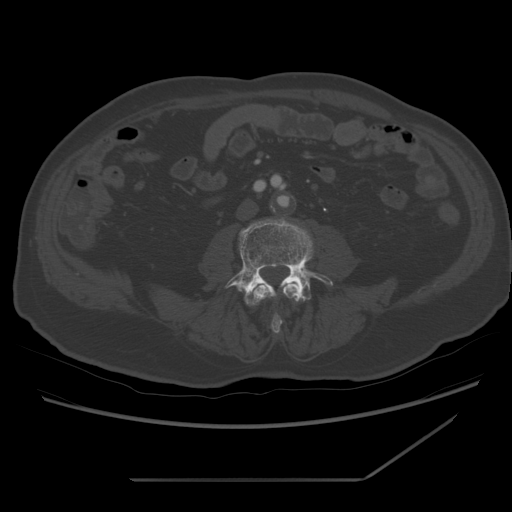
[im 493/538  soft-tissue]
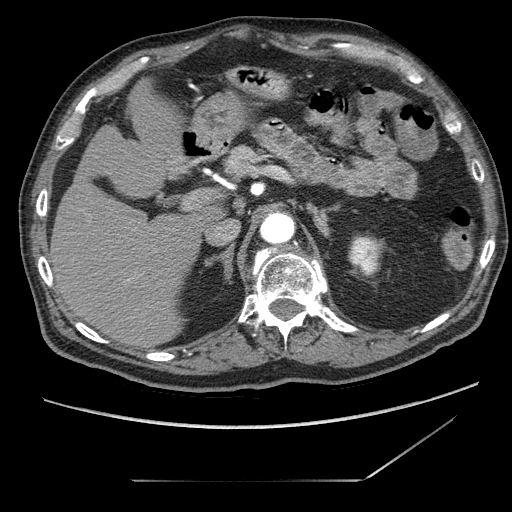

[11 of 33 positions shown; findings below may reference images not displayed]

FINDINGS: There is mild predominately cyst removed and minimally calcified
plaque throughout the distal thoracic and much of the abdominal
aorta.  There is severe narrowing and near complete occlusion of
the distal aorta, just above the bifurcation.  Aortobifemoral
bypass graft is in place.  There is minimal intimal hyperplasia
within the proximal graft.  Both limbs of the graft are widely
patent.

Celiac axis is widely patent.  Mild atherosclerotic plaquing
chronic thrombus in the distal celiac, just proximal to the
trifurcation.  No significant narrowing.  Branch vessels are all
widely patent with expected anatomy.

SMA is patent.  Mild smooth plaque and intramural thrombus in the
mid SMA without significant narrowing.  Branch vessels are all
grossly patent.

There is focal 50-60% narrowing just beyond the origin of the right
renal artery.  Proximally 40% at its origin.  The left renal artery
is patent with mild atherosclerotic change.

IMA origin is patent.  Branch vessels reconstitute and are
diminutive.

Chronic mural thrombus is present in the distal aorta with severe
narrowing.  There is severe narrowing of the right common iliac
artery.  Beyond the area of narrowing, the right common iliac
artery is aneurysmal with maximal diameter of 14 mm.  Right
external iliac artery is occluded.  Right internal iliac artery is
severely diseased.

The native left common iliac artery is occluded from its origin.
Left internal and external iliac arteries are patent.  Distal
branches of the left internal iliac artery reconstitute.

Distal anastomosis of the right common femoral bypass graft is
patent.  The right superficial femoral artery is occluded in the
proximal thigh.  Profunda femoral artery branches are patent.
Popliteal artery is severely diseased just beyond the abductor
canal.  It reconstitutes above the knee is patent  above the knee.
At the level of the knee, it becomes very diminutive.  There is
three-vessel runoff to the right ankle.  The anterior tibial artery
is very diseased and diminutive.  Peroneal and posterior tibial
arteries are mildly diseased.

Left common femoral artery bypass graft distal anastomosis is
patent.  The left superficial femoral artery is diffusely severely
diseased.  There is a near occlusion and the abductor canal.  See
table position negative 532.75. The left popliteal artery
reconstitutes well above the knee joint with a normal caliber and
mild diffuse disease.  It becomes diminutive below the knee.  There
is one vessel runoff to the left ankle via the peroneal artery.

5 mm peripheral right middle lobe nodule on image five.

1.0 cm cyst has a benign appearance in the right lobe of the liver.

Spleen, pancreas, adrenal glands are within normal limits.

Post cholecystectomy.

Multiple nonspecific hypodensities throughout the kidneys.  Most
are characterized as benign cyst.  Others are too small to
characterize.  No definite worrisome lesion.  Chronic changes of
the kidneys are noted.

No free fluid.

Bladder and prostate are within normal limits.

Diverticulosis of the sigmoid colon without evidence of acute
diverticulitis.  Normal appendix.

There is an L1 comminuted vertebral body fracture.  Fracture lines
extend throughout the vertebral body.  No extension into the
pedicles or lamina.  No splaying of the pedicles.  Acute fracture
lines are present.  There is proximally 50% loss of height
anteriorly.  There is also a 4 mm retropulsion of the superior
endplate.  The fracture has a subacute appearance.

There is a distal metaphyseal lytic lesion within the distal right
tibia.  It is concentrically located.  There is a sclerotic border
and this has a nonaggressive appearance.  No cortical breakthrough.
No periosteal reaction.

 Review of the MIP images confirms the above findings.
IMPRESSION: There is severe disease and narrowing with pre occlusion of the
most distal aorta.  Aorto bifemoral bypass graft is widely patent.
Mild smooth plaque throughout the patent aorta.

Right superficial femoral artery occlusion.  Right popliteal artery
reconstitutes above the knee.  Three-vessel runoff to the right
ankle.

Severe disease of the left superficial femoral artery, particularly
in the abductor canal.  Left popliteal artery reconstitutes well
above the knee joint.  There is only one vessel runoff to the left
ankle via the peroneal artery.

Significant right renal artery stenosis.

Significant L1 compression fracture involving the entire vertebral
body.  There is some retropulsion.  Posterior elements are intact.
It has a subacute appearance.  MRI may be helpful to further
characterize.

## 2014-05-15 ENCOUNTER — Encounter (HOSPITAL_COMMUNITY): Payer: Self-pay | Admitting: Vascular Surgery

## 2015-11-05 DEATH — deceased
